# Patient Record
Sex: Female | Born: 1995
Health system: Southern US, Community
[De-identification: ages and names within clinical notes are randomized; demographics above are authoritative.]

## PROBLEM LIST (undated history)

## (undated) DIAGNOSIS — G43909 Migraine, unspecified, not intractable, without status migrainosus: Secondary | ICD-10-CM

## (undated) DIAGNOSIS — R102 Pelvic and perineal pain: Secondary | ICD-10-CM

## (undated) DIAGNOSIS — D693 Immune thrombocytopenic purpura: Secondary | ICD-10-CM

## (undated) DIAGNOSIS — Z862 Personal history of diseases of the blood and blood-forming organs and certain disorders involving the immune mechanism: Secondary | ICD-10-CM

## (undated) DIAGNOSIS — Z832 Family history of diseases of the blood and blood-forming organs and certain disorders involving the immune mechanism: Secondary | ICD-10-CM

## (undated) DIAGNOSIS — N809 Endometriosis, unspecified: Secondary | ICD-10-CM

## (undated) DIAGNOSIS — K5904 Chronic idiopathic constipation: Secondary | ICD-10-CM

## (undated) DIAGNOSIS — D6859 Other primary thrombophilia: Secondary | ICD-10-CM

## (undated) DIAGNOSIS — K589 Irritable bowel syndrome without diarrhea: Secondary | ICD-10-CM

## (undated) HISTORY — PX: LAPAROSCOPIC ENDOMETRIOSIS FULGURATION: SUR769

## (undated) HISTORY — DX: Endometriosis, unspecified: N80.9

## (undated) HISTORY — DX: Irritable bowel syndrome, unspecified: K58.9

## (undated) HISTORY — PX: TONSILLECTOMY: SUR1361

---
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
## 2015-04-08 ENCOUNTER — Emergency Department (HOSPITAL_COMMUNITY)
Admission: EM | Admit: 2015-04-08 | Discharge: 2015-04-08 | Disposition: A | Discharge status: Home / Self Care | Payer: BLUE CROSS/BLUE SHIELD | Attending: Emergency Medicine | Admitting: Emergency Medicine

## 2015-04-08 ENCOUNTER — Encounter (HOSPITAL_COMMUNITY): Payer: Self-pay | Admitting: Emergency Medicine

## 2015-04-08 ENCOUNTER — Emergency Department (HOSPITAL_COMMUNITY): Payer: BLUE CROSS/BLUE SHIELD

## 2015-04-08 DIAGNOSIS — R102 Pelvic and perineal pain: Secondary | ICD-10-CM

## 2015-04-08 DIAGNOSIS — N83201 Unspecified ovarian cyst, right side: Secondary | ICD-10-CM | POA: Diagnosis not present

## 2015-04-08 DIAGNOSIS — Z862 Personal history of diseases of the blood and blood-forming organs and certain disorders involving the immune mechanism: Secondary | ICD-10-CM | POA: Diagnosis not present

## 2015-04-08 DIAGNOSIS — R1032 Left lower quadrant pain: Secondary | ICD-10-CM | POA: Diagnosis present

## 2015-04-08 LAB — CBC
HEMATOCRIT: 41.6 % (ref 36.0–46.0)
Hemoglobin: 14.1 g/dL (ref 12.0–15.0)
MCH: 30.9 pg (ref 26.0–34.0)
MCHC: 33.9 g/dL (ref 30.0–36.0)
MCV: 91.2 fL (ref 78.0–100.0)
PLATELETS: 316 10*3/uL (ref 150–400)
RBC: 4.56 MIL/uL (ref 3.87–5.11)
RDW: 11.8 % (ref 11.5–15.5)
WBC: 11.7 10*3/uL — ABNORMAL HIGH (ref 4.0–10.5)

## 2015-04-08 LAB — COMPREHENSIVE METABOLIC PANEL
ALBUMIN: 4.7 g/dL (ref 3.5–5.0)
ALK PHOS: 54 U/L (ref 38–126)
ALT: 41 U/L (ref 14–54)
AST: 20 U/L (ref 15–41)
Anion gap: 10 (ref 5–15)
BUN: 12 mg/dL (ref 6–20)
CO2: 26 mmol/L (ref 22–32)
CREATININE: 0.7 mg/dL (ref 0.44–1.00)
Calcium: 9.9 mg/dL (ref 8.9–10.3)
Chloride: 107 mmol/L (ref 101–111)
GFR calc Af Amer: >60 mL/min (ref >60)
GFR calc non Af Amer: >60 mL/min (ref >60)
GLUCOSE: 90 mg/dL (ref 65–99)
POTASSIUM: 3.8 mmol/L (ref 3.5–5.1)
Sodium: 143 mmol/L (ref 135–145)
TOTAL PROTEIN: 7.8 g/dL (ref 6.5–8.1)
Total Bilirubin: 0.7 mg/dL (ref 0.3–1.2)

## 2015-04-08 LAB — WET PREP, GENITAL
Clue Cells Wet Prep HPF POC: NONE SEEN
Sperm: NONE SEEN
Trich, Wet Prep: NONE SEEN
YEAST WET PREP: NONE SEEN

## 2015-04-08 LAB — LIPASE, BLOOD: Lipase: 74 U/L — ABNORMAL HIGH (ref 11–51)

## 2015-04-08 LAB — I-STAT BETA HCG BLOOD, ED (MC, WL, AP ONLY): I-stat hCG, quantitative: <5 m[IU]/mL (ref <5)

## 2015-04-08 MED ORDER — MORPHINE SULFATE (PF) 2 MG/ML IV SOLN: 2.0000 mg | Freq: Once | INTRAVENOUS | Status: DC

## 2015-04-08 MED ORDER — KETOROLAC TROMETHAMINE 30 MG/ML IJ SOLN
30.0000 mg | Freq: Once | INTRAMUSCULAR | Status: AC
  Administered 2015-04-08: 30 mg via INTRAVENOUS
  Filled 2015-04-08: qty 1

## 2015-04-08 NOTE — ED Notes (Signed)
Pt made aware of need for urine specimen 

## 2015-04-08 NOTE — ED Notes (Signed)
Pt c/o diffuse abdominal pain. No n/v/diarrhea, no urinary symptoms. No aggravating/alleviating factors.

## 2015-04-08 NOTE — ED Provider Notes (Signed)
CSN: [id number]     Arrival date & time 04/08/15  1355 History   First MD Initiated Contact with Patient 04/08/15 1819     Chief Complaint  Patient presents with  . Abdominal Pain     (Consider location/radiation/quality/duration/timing/severity/associated sxs/prior Treatment) HPI 19 year old female who presents with low abdominal pain. History of protein S deficiency and ITP. At around 12:30PM today after eating, developed severe LLQ pain that radiated towards the right. Had IUD placed 2 weeks ago, and has been having mild vaginal spotting. Was tested for STDs which was negative prior to placement. No abnormal vaginal discharge, N/V/D, fevers, chills, dysuria, urinary frequency, flank pain.    Past Medical History  Diagnosis Date  . ITP (idiopathic thrombocytopenic purpura)   . Protein S deficiency (HCC)    History reviewed. No pertinent past surgical history. History reviewed. No pertinent family history. Social History  Substance Use Topics  . Smoking status: Never Smoker   . Smokeless tobacco: None  . Alcohol Use: None   OB History    No data available     Review of Systems 10/14 systems reviewed and are negative other than those stated in the HPI    Allergies  Review of patient's allergies indicates no known allergies.  Home Medications   Prior to Admission medications   Medication Sig Start Date End Date Taking? Authorizing Provider  HYDROcodone-acetaminophen (HYCET) 7.5-325 mg/15 ml solution take 15 milliliters by mouth every 4 hours if needed for pain 03/31/15  Yes Historical Provider, MD  ciprofloxacin (CIPRO) 500 MG tablet Take 1 tablet (500 mg total) by mouth 2 (two) times daily. One PO bid x 14 days Patient not taking: Reported on 04/08/2015 06/21/13   Elwin Mocha, MD   BP 103/66 mmHg  Pulse 87  Temp(Src) 97.8 F (36.6 C) (Oral)  Resp 16  SpO2 100% Physical Exam Physical Exam  Nursing note and vitals reviewed. Constitutional: Well developed, well  nourished, non-toxic, and in no acute distress Head: Normocephalic and atraumatic.  Mouth/Throat: Oropharynx is clear and moist.  Neck: Normal range of motion. Neck supple.  Cardiovascular: Normal rate and regular rhythm.   Pulmonary/Chest: Effort normal and breath sounds normal.  Abdominal: Soft. There is no tenderness. There is no rebound and no guarding.  Musculoskeletal: Normal range of motion.  Neurological: Alert, no facial droop, fluent speech, moves all extremities symmetrically Skin: Skin is warm and dry.  Psychiatric: Cooperative Pelvic: Normal external genitalia. Normal internal genitalia. No discharge. Dried blood within the vagina. Minimal cervical motion tenderness. Right adnexal tenderness. No adnexal masses appreciated.  ED Course  Procedures (including critical care time) Labs Review Labs Reviewed  WET PREP, GENITAL - Abnormal; Notable for the following:    WBC, Wet Prep HPF POC FEW (*)    All other components within normal limits  LIPASE, BLOOD - Abnormal; Notable for the following:    Lipase 74 (*)    All other components within normal limits  CBC - Abnormal; Notable for the following:    WBC 11.7 (*)    All other components within normal limits  COMPREHENSIVE METABOLIC PANEL  URINALYSIS, ROUTINE W REFLEX MICROSCOPIC (NOT AT [facility])  HIV ANTIBODY (ROUTINE TESTING)  I-STAT BETA HCG BLOOD, ED ([facility], [facility], [facility] ONLY)  GC/CHLAMYDIA PROBE AMP ([place]) NOT AT [facility]    Imaging Review US Transvaginal Non-ob  04/08/2015  CLINICAL DATA:  19 year old female with lower pelvic pain and right-sided suprapubic pain. Recent placement of IUD. EXAM: TRANSABDOMINAL AND  TRANSVAGINAL ULTRASOUND OF PELVIS DOPPLER ULTRASOUND OF OVARIES TECHNIQUE: Both transabdominal and transvaginal ultrasound examinations of the pelvis were performed. Transabdominal technique was performed for global imaging of the pelvis including uterus, ovaries, adnexal regions, and pelvic cul-de-sac. It was necessary  to proceed with endovaginal exam following the transabdominal exam to visualize the adnexal regions. Color and duplex Doppler ultrasound was utilized to evaluate blood flow to the ovaries. COMPARISON:  No priors. FINDINGS: Uterus Measurements: 6.4 x 4.1 x 4.9 cm. No fibroids or other mass visualized. Endometrium Thickness: 10 mm.  IUD noted. Right ovary Measurements: 2.8 x 2.4 x 2.5 cm. Complex lesion measuring 2.3 x 1.7 x 1.7 cm that is generally anechoic with increased through transmission, however, there appears to be several thin (<3 mm) septations. Left ovary Measurements: 3.6 x 2.4 x 2.7 cm. Normal appearance/no adnexal mass. Pulsed Doppler evaluation of both ovaries demonstrates normal low-resistance arterial and venous waveforms. Other findings Trace volume of free fluid in the cul-de-sac IMPRESSION: 1. IUD noted in the endometrial canal. 2. Trace volume of free fluid in the cul-de-sac, presumably physiologic in this young female patient. 3. Mildly complex cyst in the right ovary, as above. Short-interval follow up ultrasound in 6-12 weeks is recommended, preferably during the week following the patient's normal menses. Electronically Signed   By: Trudie Reed M.D.   On: 04/08/2015 19:55   US Pelvis Complete  04/08/2015  CLINICAL DATA:  19 year old female with lower pelvic pain and right-sided suprapubic pain. Recent placement of IUD. EXAM: TRANSABDOMINAL AND TRANSVAGINAL ULTRASOUND OF PELVIS DOPPLER ULTRASOUND OF OVARIES TECHNIQUE: Both transabdominal and transvaginal ultrasound examinations of the pelvis were performed. Transabdominal technique was performed for global imaging of the pelvis including uterus, ovaries, adnexal regions, and pelvic cul-de-sac. It was necessary to proceed with endovaginal exam following the transabdominal exam to visualize the adnexal regions. Color and duplex Doppler ultrasound was utilized to evaluate blood flow to the ovaries. COMPARISON:  No priors. FINDINGS: Uterus  Measurements: 6.4 x 4.1 x 4.9 cm. No fibroids or other mass visualized. Endometrium Thickness: 10 mm.  IUD noted. Right ovary Measurements: 2.8 x 2.4 x 2.5 cm. Complex lesion measuring 2.3 x 1.7 x 1.7 cm that is generally anechoic with increased through transmission, however, there appears to be several thin (<3 mm) septations. Left ovary Measurements: 3.6 x 2.4 x 2.7 cm. Normal appearance/no adnexal mass. Pulsed Doppler evaluation of both ovaries demonstrates normal low-resistance arterial and venous waveforms. Other findings Trace volume of free fluid in the cul-de-sac IMPRESSION: 1. IUD noted in the endometrial canal. 2. Trace volume of free fluid in the cul-de-sac, presumably physiologic in this young female patient. 3. Mildly complex cyst in the right ovary, as above. Short-interval follow up ultrasound in 6-12 weeks is recommended, preferably during the week following the patient's normal menses. Electronically Signed   By: Trudie Reed M.D.   On: 04/08/2015 19:55   [place] Art/ven Flow Abd Pelv Doppler Limited  04/08/2015  CLINICAL DATA:  19 year old female with lower pelvic pain and right-sided suprapubic pain. Recent placement of IUD. EXAM: TRANSABDOMINAL AND TRANSVAGINAL ULTRASOUND OF PELVIS DOPPLER ULTRASOUND OF OVARIES TECHNIQUE: Both transabdominal and transvaginal ultrasound examinations of the pelvis were performed. Transabdominal technique was performed for global imaging of the pelvis including uterus, ovaries, adnexal regions, and pelvic cul-de-sac. It was necessary to proceed with endovaginal exam following the transabdominal exam to visualize the adnexal regions. Color and duplex Doppler ultrasound was utilized to evaluate blood flow to the ovaries. COMPARISON:  No  priors. FINDINGS: Uterus Measurements: 6.4 x 4.1 x 4.9 cm. No fibroids or other mass visualized. Endometrium Thickness: 10 mm.  IUD noted. Right ovary Measurements: 2.8 x 2.4 x 2.5 cm. Complex lesion measuring 2.3 x 1.7 x 1.7 cm  that is generally anechoic with increased through transmission, however, there appears to be several thin (<3 mm) septations. Left ovary Measurements: 3.6 x 2.4 x 2.7 cm. Normal appearance/no adnexal mass. Pulsed Doppler evaluation of both ovaries demonstrates normal low-resistance arterial and venous waveforms. Other findings Trace volume of free fluid in the cul-de-sac IMPRESSION: 1. IUD noted in the endometrial canal. 2. Trace volume of free fluid in the cul-de-sac, presumably physiologic in this young female patient. 3. Mildly complex cyst in the right ovary, as above. Short-interval follow up ultrasound in 6-12 weeks is recommended, preferably during the week following the patient's normal menses. Electronically Signed   By: Trudie Reed M.D.   On: 04/08/2015 19:55   I have personally reviewed and evaluated these images and lab results as part of my medical decision-making.   MDM   Final diagnoses:  Pelvic pain in female  Right ovarian cyst    19 year old female who presents with low abdominal pain at 12:30 today. On arrival is nontoxic and in no acute distress. Vital signs are within normal limits. She is a soft and nonsurgical abdomen. No tenderness at McBurney's point just evidence of acute appendicitis. Her pain is primarily now in the right adnexa. Some pain suprapubically as well. Concern for pelvic process especially in the setting of her recent IUD. Unlikely to be PID as she has been tested for STDs just prior to IUD placement. Pelvic exam with dried blood, consistent with vaginal spotting since IUD. Mild cervical motion tenderness and primarily right adnexal tenderness to palpation. Pelvic ultrasound was performed and she has evidence of a complex cyst in her right ovary, with some mild free fluid in the pelvis. This is likely the etiology of her pain. IUD is in place on ultrasound. Low suspicion for torsion she has normal arterial and venous flow to her ovaries, pain not escalating in  severity. She did receive Toradol for pain control. Remainder of blood work including CBC, comprehensive metabolic panel, lipase, pregnancy test are overall unremarkable. Given supportive care instructions regarding her cyst. Given warning signs for ovarian torsion. She has follow-up with her gynecologist for IUD evaluation. She will also follow up with her primary care doctor prior to this. She and her mother expressed understanding of all discharge instructions for comfortable to plan of care.  Lavera Guise, MD 04/09/15 [phone]

## 2015-04-08 NOTE — ED Notes (Signed)
Pt. Unable to give urine at this time. 

## 2015-04-08 NOTE — Discharge Instructions (Signed)
Your have a cyst in the right ovary. Return without fail for worsening symptoms, including fever, vomiting and unable to keep down food/fluids, or any other symptoms concerning to you.  Pelvic Pain, Female Female pelvic pain can be caused by many different things and start from a variety of places. Pelvic pain refers to pain that is located in the lower half of the abdomen and between your hips. The pain may occur over a short period of time (acute) or may be reoccurring (chronic). The cause of pelvic pain may be related to disorders affecting the female reproductive organs (gynecologic), but it may also be related to the bladder, kidney stones, an intestinal complication, or muscle or skeletal problems. Getting help right away for pelvic pain is important, especially if there has been severe, sharp, or a sudden onset of unusual pain. It is also important to get help right away because some types of pelvic pain can be life threatening.  CAUSES  Below are only some of the causes of pelvic pain. The causes of pelvic pain can be in one of several categories.   Gynecologic.  Pelvic inflammatory disease.  Sexually transmitted infection.  Ovarian cyst or a twisted ovarian ligament (ovarian torsion).  Uterine lining that grows outside the uterus (endometriosis).  Fibroids, cysts, or tumors.  Ovulation.  Pregnancy.  Pregnancy that occurs outside the uterus (ectopic pregnancy).  Miscarriage.  Labor.  Abruption of the placenta or ruptured uterus.  Infection.  Uterine infection (endometritis).  Bladder infection.  Diverticulitis.  Miscarriage related to a uterine infection (septic abortion).  Bladder.  Inflammation of the bladder (cystitis).  Kidney stone(s).  Gastrointestinal.  Constipation.  Diverticulitis.  Neurologic.  Trauma.  Feeling pelvic pain because of mental or emotional causes (psychosomatic).  Cancers of the bowel or pelvis. EVALUATION  Your caregiver  will want to take a careful history of your concerns. This includes recent changes in your health, a careful gynecologic history of your periods (menses), and a sexual history. Obtaining your family history and medical history is also important. Your caregiver may suggest a pelvic exam. A pelvic exam will help identify the location and severity of the pain. It also helps in the evaluation of which organ system may be involved. In order to identify the cause of the pelvic pain and be properly treated, your caregiver may order tests. These tests may include:   A pregnancy test.  Pelvic ultrasonography.  An X-ray exam of the abdomen.  A urinalysis or evaluation of vaginal discharge.  Blood tests. HOME CARE INSTRUCTIONS   Only take over-the-counter or prescription medicines for pain, discomfort, or fever as directed by your caregiver.   Rest as directed by your caregiver.   Eat a balanced diet.   Drink enough fluids to make your urine clear or pale yellow, or as directed.   Avoid sexual intercourse if it causes pain.   Apply warm or cold compresses to the lower abdomen depending on which one helps the pain.   Avoid stressful situations.   Keep a journal of your pelvic pain. Write down when it started, where the pain is located, and if there are things that seem to be associated with the pain, such as food or your menstrual cycle.  Follow up with your caregiver as directed.  SEEK MEDICAL CARE IF:  Your medicine does not help your pain.  You have abnormal vaginal discharge. SEEK IMMEDIATE MEDICAL CARE IF:   You have heavy bleeding from the vagina.   Your pelvic  pain increases.   You feel light-headed or faint.   You have chills.   You have pain with urination or blood in your urine.   You have uncontrolled diarrhea or vomiting.   You have a fever or persistent symptoms for more than 3 days.  You have a fever and your symptoms suddenly get worse.   You are  being physically or sexually abused.   This information is not intended to replace advice given to you by your health care provider. Make sure you discuss any questions you have with your health care provider.   Document Released: 02/21/2004 Document Revised: 12/15/2014 Document Reviewed: 07/16/2011 Elsevier Interactive Patient Education [employer]

## 2015-04-08 NOTE — ED Notes (Signed)
Amanda Daugherty and this nurse attempt IV unsuccessful; pt transported to [place] at present time. IV consult placed.

## 2015-04-09 LAB — HIV ANTIBODY (ROUTINE TESTING W REFLEX): HIV Screen 4th Generation wRfx: NONREACTIVE

## 2015-04-12 LAB — GC/CHLAMYDIA PROBE AMP (~~LOC~~) NOT AT ARMC
Chlamydia: NEGATIVE
NEISSERIA GONORRHEA: NEGATIVE

## 2016-12-18 LAB — HM PAP SMEAR

## 2017-12-25 LAB — HM PAP SMEAR

## 2019-09-18 ENCOUNTER — Ambulatory Visit
Admission: EM | Admit: 2019-09-18 | Discharge: 2019-09-18 | Disposition: A | Discharge status: Home / Self Care | Payer: PRIVATE HEALTH INSURANCE | Attending: Physician Assistant | Admitting: Physician Assistant

## 2019-09-18 ENCOUNTER — Other Ambulatory Visit: Payer: Self-pay

## 2019-09-18 ENCOUNTER — Ambulatory Visit: Admission: EM | Admit: 2019-09-18 | Discharge status: Absent without leave | Payer: Self-pay | Source: Home / Self Care

## 2019-09-18 DIAGNOSIS — H6123 Impacted cerumen, bilateral: Secondary | ICD-10-CM

## 2019-09-18 NOTE — Discharge Instructions (Signed)
Ear wax removed today. If continue to have ear fullness, can try over the counter flonase/nasacort for possible eustachian tube dysfunction causing symptoms. Follow up for reevaluation if symptoms not improving, having pain.  

## 2019-09-18 NOTE — ED Triage Notes (Signed)
Bilateral ear pain since Sunday, used OTC ear wax removal kit without improvement

## 2019-09-18 NOTE — ED Provider Notes (Signed)
[facility]    CSN: [id number] Arrival date & time: 09/18/19  1715      History   Chief Complaint Chief Complaint  Patient presents with  . Otalgia    HPI RAVINDER HOFLAND is a 24 y.o. female.   24 year old female comes in for few day history of bilateral ear pain, L>R.  Also with muffled hearing, and thought this was due to earwax.  Tried OTC earwax removal kit, but was unsuccessful.  Does use cotton socks regularly.  Denies any ear drainage, recent swimming.  Denies URI symptoms, fever.     Past Medical History:  Diagnosis Date  . ITP (idiopathic thrombocytopenic purpura)   . Protein S deficiency ([place])     There are no problems to display for this patient.   History reviewed. No pertinent surgical history.  OB History   No obstetric history on file.      Home Medications    Prior to Admission medications   Not on File    Family History Family History  Problem Relation Age of Onset  . Healthy Mother   . Healthy Father     Social History Social History   Tobacco Use  . Smoking status: Never Smoker  Vaping Use  . Vaping Use: Never used  Substance Use Topics  . Alcohol use: Never  . Drug use: Never     Allergies   Patient has no known allergies.   Review of Systems Review of Systems  Reason unable to perform ROS: See HPI as above.     Physical Exam Triage Vital Signs ED Triage Vitals [09/18/19 1721]  Enc Vitals Group     BP 128/78     Pulse Rate 76     Resp 16     Temp 98 F (36.7 C)     Temp src      SpO2 97 %     Weight      Height      Head Circumference      Peak Flow      Pain Score 6     Pain Loc      Pain Edu?      Excl. in [place]?    No data found.  Updated Vital Signs BP 128/78   Pulse 76   Temp 98 F (36.7 C)   Resp 16   LMP 09/11/2019   SpO2 97%   Physical Exam Constitutional:      General: She is not in acute distress.    Appearance: Normal appearance. She is well-developed. She is not  toxic-appearing or diaphoretic.  HENT:     Head: Normocephalic and atraumatic.     Ears:     Comments: Tenderness to palpation of bilateral tragus.  No ear canal swelling, erythema, warmth.  Bilateral ear canal with cerumen impaction, TM not visible. Eyes:     Conjunctiva/sclera: Conjunctivae normal.     Pupils: Pupils are equal, round, and reactive to light.  Pulmonary:     Effort: Pulmonary effort is normal. No respiratory distress.     Comments: Speaking in full sentences without difficulty Musculoskeletal:     Cervical back: Normal range of motion and neck supple.  Skin:    General: Skin is warm and dry.  Neurological:     Mental Status: She is alert and oriented to person, place, and time.      UC Treatments / Results  Labs (all labs ordered are listed, but only abnormal results  are displayed) Labs Reviewed - No data to display  EKG   Radiology No results found.  Procedures Procedures (including critical care time)  Medications Ordered in UC Medications - No data to display  Initial Impression / Assessment and Plan / UC Course  I have reviewed the triage vital signs and the nursing notes.  Pertinent labs & imaging results that were available during my care of the patient were reviewed by me and considered in my medical decision making (see chart for details).  Patient tolerated ear irrigation well. TM visible without otitis media. Discussed using flonase/nasacort if continues with symptoms. Return precautions given.   Final Clinical Impressions(s) / UC Diagnoses   Final diagnoses:  Bilateral impacted cerumen    ED Prescriptions    None     PDMP not reviewed this encounter.   Ok Edwards, PA-C 09/18/19 1758

## 2019-11-27 ENCOUNTER — Other Ambulatory Visit: Payer: Self-pay

## 2019-11-27 ENCOUNTER — Ambulatory Visit
Admission: EM | Admit: 2019-11-27 | Discharge: 2019-11-27 | Disposition: A | Discharge status: Home / Self Care | Payer: PRIVATE HEALTH INSURANCE | Attending: Physician Assistant | Admitting: Physician Assistant

## 2019-11-27 DIAGNOSIS — J029 Acute pharyngitis, unspecified: Secondary | ICD-10-CM | POA: Diagnosis present

## 2019-11-27 DIAGNOSIS — Z1152 Encounter for screening for COVID-19: Secondary | ICD-10-CM | POA: Diagnosis not present

## 2019-11-27 LAB — POCT RAPID STREP A (OFFICE): Rapid Strep A Screen: NEGATIVE

## 2019-11-27 MED ORDER — LIDOCAINE VISCOUS HCL 2 % MT SOLN: OROMUCOSAL | 0 refills | Status: DC

## 2019-11-27 NOTE — Discharge Instructions (Signed)
Rapid strep negative. COVID testing ordered. Symptoms are most likely due to viral illness/ drainage down your throat. Start lidocaine for sore throat, do not eat or drink for the next 40 mins after use as it can stunt your gag reflex. Over the counter Flonase for nasal congestion/drainage. You can use over the counter nasal saline rinse such as neti pot for nasal congestion. Monitor for any worsening of symptoms, swelling of the throat, trouble breathing, trouble swallowing, leaning forward to breath, drooling, go to the emergency department for further evaluation needed.

## 2019-11-27 NOTE — ED Provider Notes (Signed)
[facility]    CSN: [id number] Arrival date & time: 11/27/19  1037      History   Chief Complaint Chief Complaint  Patient presents with  . Sore Throat    HPI Amanda Daugherty is a 24 y.o. female.   24 year old female comes in for 5 day of URI symptoms. Throat irritation, voice hoarseness, bilateral ear pain. No obvious rhinorrhea, nasal congestion, cough. Denies fever, chills, body aches. Denies abdominal pain, nausea, vomiting, diarrhea. Denies shortness of breath, loss of taste/smell. Fully COVID vaccinated. S/p tonsillectomy.      Past Medical History:  Diagnosis Date  . ITP (idiopathic thrombocytopenic purpura)   . Protein S deficiency ([facility])     There are no problems to display for this patient.   Past Surgical History:  Procedure Laterality Date  . TONSILLECTOMY      OB History   No obstetric history on file.      Home Medications    Prior to Admission medications   Medication Sig Start Date End Date Taking? Authorizing Provider  lidocaine (XYLOCAINE) 2 % solution 5-15 mL gurgle as needed 11/27/19   Belinda Fisher, PA-C    Family History Family History  Problem Relation Age of Onset  . Healthy Mother   . Healthy Father     Social History Social History   Tobacco Use  . Smoking status: Never Smoker  . Smokeless tobacco: Never Used  Vaping Use  . Vaping Use: Never used  Substance Use Topics  . Alcohol use: Yes  . Drug use: Never     Allergies   Patient has no known allergies.   Review of Systems Review of Systems  Reason unable to perform ROS: See HPI as above.     Physical Exam Triage Vital Signs ED Triage Vitals  Enc Vitals Group     BP 11/27/19 1117 115/76     Pulse Rate 11/27/19 1117 71     Resp 11/27/19 1117 16     Temp 11/27/19 1117 97.9 F (36.6 C)     Temp Source 11/27/19 1117 Oral     SpO2 11/27/19 1117 98 %     Weight --      Height --      Head Circumference --      Peak Flow --      Pain Score  11/27/19 1122 4     Pain Loc --      Pain Edu? --      Excl. in [place]? --    No data found.  Updated Vital Signs BP 115/76 (BP Location: Right Arm)   Pulse 71   Temp 97.9 F (36.6 C) (Oral)   Resp 16   LMP 11/26/2019   SpO2 98%   Physical Exam Constitutional:      General: She is not in acute distress.    Appearance: Normal appearance. She is well-developed. She is not ill-appearing, toxic-appearing or diaphoretic.  HENT:     Head: Normocephalic and atraumatic.     Right Ear: Tympanic membrane, ear canal and external ear normal. Tympanic membrane is not erythematous or bulging.     Left Ear: Tympanic membrane, ear canal and external ear normal. Tympanic membrane is not erythematous or bulging.     Nose:     Right Sinus: Frontal sinus tenderness present. No maxillary sinus tenderness.     Left Sinus: Frontal sinus tenderness present. No maxillary sinus tenderness.     Mouth/Throat:  Mouth: Mucous membranes are moist.     Pharynx: Oropharynx is clear. Uvula midline.  Eyes:     Conjunctiva/sclera: Conjunctivae normal.     Pupils: Pupils are equal, round, and reactive to light.  Cardiovascular:     Rate and Rhythm: Normal rate and regular rhythm.  Pulmonary:     Effort: Pulmonary effort is normal. No accessory muscle usage, prolonged expiration, respiratory distress or retractions.     Breath sounds: No decreased air movement or transmitted upper airway sounds. No decreased breath sounds.     Comments: LCTAB Musculoskeletal:     Cervical back: Normal range of motion and neck supple.  Skin:    General: Skin is warm and dry.  Neurological:     Mental Status: She is alert and oriented to person, place, and time.      UC Treatments / Results  Labs (all labs ordered are listed, but only abnormal results are displayed) Labs Reviewed  NOVEL CORONAVIRUS, NAA  CULTURE, GROUP A STREP [facility])  POCT RAPID STREP A (OFFICE)    EKG   Radiology No results  found.  Procedures Procedures (including critical care time)  Medications Ordered in UC Medications - No data to display  Initial Impression / Assessment and Plan / UC Course  I have reviewed the triage vital signs and the nursing notes.  Pertinent labs & imaging results that were available during my care of the patient were reviewed by me and considered in my medical decision making (see chart for details).    Rapid strep negative. COVID PCR test ordered. Patient to quarantine until testing results return. No alarming signs on exam. LCTAB. Symptomatic treatment discussed.  Push fluids.  Return precautions given.  Patient expresses understanding and agrees to plan.  Final Clinical Impressions(s) / UC Diagnoses   Final diagnoses:  Encounter for screening for COVID-19  Sore throat    ED Prescriptions    Medication Sig Dispense Auth. Provider   lidocaine (XYLOCAINE) 2 % solution 5-15 mL gurgle as needed 150 mL Belinda Fisher, PA-C     PDMP not reviewed this encounter.   Belinda Fisher, PA-C 11/27/19 1327

## 2019-11-27 NOTE — ED Triage Notes (Signed)
Pt states woke up Monday with her voice gone. States having a scratchy throat and pain to swallow. States now having bilateral ear pain. States had a tonsils removed 5 yrs ago and it feels like that healing pain. Denies difficuly swallowing.

## 2019-11-29 LAB — NOVEL CORONAVIRUS, NAA: SARS-CoV-2, NAA: NOT DETECTED

## 2019-11-29 LAB — SARS-COV-2, NAA 2 DAY TAT

## 2019-11-30 LAB — CULTURE, GROUP A STREP (THRC)

## 2019-12-15 ENCOUNTER — Ambulatory Visit: Payer: PRIVATE HEALTH INSURANCE | Admitting: Nurse Practitioner

## 2020-03-21 ENCOUNTER — Other Ambulatory Visit: Payer: Self-pay

## 2020-03-21 ENCOUNTER — Ambulatory Visit (INDEPENDENT_AMBULATORY_CARE_PROVIDER_SITE_OTHER): Payer: PRIVATE HEALTH INSURANCE | Admitting: Nurse Practitioner

## 2020-03-21 ENCOUNTER — Encounter: Payer: Self-pay | Admitting: Nurse Practitioner

## 2020-03-21 VITALS — BP 118/62 | HR 68 | Temp 97.7°F | Ht 68.0 in | Wt 180.0 lb

## 2020-03-21 DIAGNOSIS — Z0001 Encounter for general adult medical examination with abnormal findings: Secondary | ICD-10-CM

## 2020-03-21 DIAGNOSIS — Z136 Encounter for screening for cardiovascular disorders: Secondary | ICD-10-CM | POA: Diagnosis not present

## 2020-03-21 DIAGNOSIS — Z13220 Encounter for screening for lipoid disorders: Secondary | ICD-10-CM

## 2020-03-21 DIAGNOSIS — F411 Generalized anxiety disorder: Secondary | ICD-10-CM | POA: Insufficient documentation

## 2020-03-21 DIAGNOSIS — Z23 Encounter for immunization: Secondary | ICD-10-CM

## 2020-03-21 DIAGNOSIS — R110 Nausea: Secondary | ICD-10-CM | POA: Diagnosis not present

## 2020-03-21 DIAGNOSIS — F419 Anxiety disorder, unspecified: Secondary | ICD-10-CM | POA: Insufficient documentation

## 2020-03-21 DIAGNOSIS — K5904 Chronic idiopathic constipation: Secondary | ICD-10-CM | POA: Diagnosis not present

## 2020-03-21 MED ORDER — DOCUSATE SODIUM 100 MG PO CAPS: 100.0000 mg | ORAL_CAPSULE | Freq: Two times a day (BID) | ORAL | 0 refills | Status: DC

## 2020-03-21 NOTE — Assessment & Plan Note (Signed)
Managed by GYN with use of klonopin prn

## 2020-03-21 NOTE — Patient Instructions (Signed)
Schedule lab appt for fasting blood draw (need to be fasting at least 6-8hrs prior, ok to drink water)  Start colace daily for constipation.   you will be contacted to schedule an appt for ABD [place].  Sign medical release to get records from your GYN  Thank you for choosing [facility].  Chronic Constipation  Chronic constipation is a condition in which a person has three or fewer bowel movements a week, for three months or longer. This condition is especially common in older adults. The two main kinds of chronic constipation are secondary constipation and functional constipation. Secondary constipation results from another condition or a treatment. Functional constipation, also called primary or idiopathic constipation, is divided into three types:  Normal transit constipation. In this type, movement of stool through the colon (stool transit) occurs normally.  Slow transit constipation. In this type, stool moves slowly through the colon.  Outlet constipation or pelvic floor dysfunction. In this type, the nerves and muscles that empty the rectum do not work normally. What are the causes? Causes of secondary constipation may include:  Failing to drink enough fluid, eat enough food or fiber, or get physically active.  Pregnancy.  A tear in the anus (anal fissure).  Blockage in the bowel (bowel obstruction).  Narrowing of the bowel (bowel stricture).  Having a long-term medical condition, such as: ? Diabetes. ? Hypothyroidism. ? Multiple sclerosis. ? Parkinson disease. ? Stroke. ? Spinal cord injury. ? Dementia. ? Colon cancer. ? Inflammatory bowel disease (IBD). ? Iron-deficiency anemia. ? Outward collapse of the rectum (rectal prolapse). ? Hemorrhoids.  Taking certain medicines, including: ? Narcotics. These are a certain type of prescription pain medicine. ? Antacids. ? Iron supplements. ? Water pills (diuretics). ? Certain blood pressure  medicines. ? Anti-seizure medicines. ? Antidepressants. ? Medicines for Parkinson disease. The cause of functional constipation is not known, but some conditions are associated with it. These conditions include:  Stress.  Problems in the nerves and muscles that control stool transit.  Weak or impaired pelvic floor muscles. What increases the risk? You may be at higher risk for chronic constipation if you:  Are older than age 28.  Are female.  Live in a long-term care facility.  Do not get much exercise or physical activity (have a sedentary lifestyle).  Do not drink enough fluids.  Do not eat enough food, especially fiber.  Have a long-term disease.  Have a mental health disorder or eating disorder.  Take many medicines. What are the signs or symptoms? The main symptom of chronic constipation is having three or fewer bowel movements a week for several weeks. Other signs and symptoms may vary from person to person. These include:  Pushing hard (straining) to pass stool.  Painful bowel movements.  Having hard or lumpy stools.  Having lower belly discomfort, such as cramps or bloating.  Being unable to have a bowel movement when you feel the urge.  Feeling like you still need to pass stool after a bowel movement.  Feeling that you have something in your rectum that is blocking or preventing bowel movements.  Seeing blood on the toilet paper or in your stool.  Worsening confusion (in older adults). How is this diagnosed? This condition may be diagnosed based on:  Symptoms and medical history. You will be asked about your symptoms, lifestyle, diet, and any medicines that you are taking.  Physical exam. ? Your belly (abdomen) will be examined. ? A digital rectal exam may be done.  For this exam, a health care provider places a lubricated, gloved finger into the rectum.  Other tests to check for any underlying causes of your constipation. These may be ordered if  you have bleeding in your rectum, weight loss, or a family history of colon cancer. In these cases, you may have: ? Imaging studies of the colon. These may include X-ray, ultrasound, or CT scan. ? Blood tests. ? A procedure to examine the inside of your colon (colonoscopy). ? More specialized tests to check:  Whether your anal sphincter works well. This is a ring-shaped muscle that controls the closing of the anus.  How well food moves through your colon. ? Tests to measure the nerve signal in your pelvic floor muscles (electromyography). How is this treated? Treatment for chronic constipation depends on the cause. Most often, treatment starts with:  Being more active and getting regular exercise.  Drinking more fluids.  Adding fiber to your diet. Sources of fiber include fruits, vegetables, whole grains, and fiber supplements.  Using medicines such as stool softeners or medicines that increase contractions in your digestive system (pro-motility agents).  Training your pelvic muscles with biofeedback.  Surgery, if there is obstruction. Treatment for secondary chronic constipation depends on the underlying condition. You may need to:  Stop or change some medicines if they cause constipation.  Use a fiber supplement (bulk laxative) or stool softener.  Use prescription laxative. This works by [employer] into your colon (osmotic laxative). You may also need to see a specialist who treats conditions of the digestive system (gastroenterologist). Follow these instructions at home:   Take over-the-counter and prescription medicines only as told by your health care provider.  If you are taking a laxative, take it as told by your health care provider.  Eat a balanced diet that includes enough fiber. Ask your health care provider to recommend a diet that is right for you.  Drink clear fluids, especially water. Avoid drinking alcohol, caffeine, and soda.  Drink enough fluid to  keep your urine pale yellow.  Get some physical activity every day. Ask your health care provider what physical activities are safe for you.  Get colon cancer screenings as told by your health care provider.  Keep all follow-up visits as told by your health care provider. This is important. Contact a health care provider if:  You are having three or fewer bowel movements a week.  Your stools are hard or lumpy.  You notice blood on the toilet paper or in your stool after you have a bowel movement.  You have unexplained weight loss.  You have rectum (rectal) pain.  You have stool leakage.  You experience nausea or vomiting. Get help right away if:  You have rectal bleeding or you pass blood clots.  You have severe rectal pain.  You have body tissue that pushes out (protrudes) from your anus.  You have severe pain or bloating (distension) in your abdomen.  You have vomiting that you cannot control. Summary  Chronic constipation is a condition in which a person has three or fewer bowel movements a week, for three months or longer.  You may have a higher risk for this condition if you are an older adult, or if you do not drink enough water or get enough physical activity (are sedentary).  Treatment for this condition depends on the cause. Most treatments for chronic constipation include adding fiber to your diet, drinking more fluids, and getting more physical activity. You may also  need to treat any underlying medical conditions or stop or change certain medicines if they cause constipation.  If lifestyle changes do not relieve constipation, your health care provider may recommend taking a laxative. This information is not intended to replace advice given to you by your health care provider. Make sure you discuss any questions you have with your health care provider. Document Revised: 03/08/2017 Document Reviewed: 12/11/2016 Elsevier Patient Education  2020 [employer].

## 2020-03-21 NOTE — Assessment & Plan Note (Signed)
Chronic, waxing and waning, hard stool 1-2x/week Associated with ABD bloating. Improves with use of laxative. Maintain high fiber diet and adequate oral hydration  Start colace 100-200mg  OTC.

## 2020-03-21 NOTE — Assessment & Plan Note (Addendum)
Ongoing since high school. Worsening,Triggered by food intake. Intermittent, each episode last for 1-2days. Associated with bloating sensation Denies weight loss or vomiting or blood in stool or heratburn Also has hx of chronic constipation.  Check ABD [place] and H-pylori

## 2020-03-21 NOTE — Assessment & Plan Note (Signed)
>>  ASSESSMENT AND PLAN FOR ANXIETY WRITTEN ON 03/21/2020 12:39 PM BY Klarissa Mcilvain LUM, NP  Managed by GYN with use of klonopin prn

## 2020-03-21 NOTE — Progress Notes (Signed)
Subjective:    Patient ID: Amanda Daugherty, female    DOB: 01-Jul-1995, 24 y.o.   MRN: [id number]  Patient presents today for CPE and eval of chronic complaints.  HPI Anxiety Managed by GYN with use of klonopin prn  Nausea Ongoing since high school. Worsening,Triggered by food intake. Intermittent, each episode last for 1-2days. Associated with bloating sensation Denies weight loss or vomiting or blood in stool or heratburn Also has hx of chronic constipation.  Check ABD [place] and H-pylori  Chronic idiopathic constipation Chronic, waxing and waning, hard stool 1-2x/week Associated with ABD bloating. Improves with use of laxative. Maintain high fiber diet and adequate oral hydration  Start colace 100-200mg  OTC.  Sexual History (orientation,birth control, marital status, STD):IUD in place, GYN has completed pelvic and breast exam per patient.  Depression/Suicide: Depression screen PHQ 2/9 03/21/2020  Decreased Interest 1  Down, Depressed, Hopeless 1  PHQ - 2 Score 2  Altered sleeping 1  Tired, decreased energy 0  Change in appetite 1  Feeling bad or failure about yourself  1  Trouble concentrating 0  Moving slowly or fidgety/restless 0  Suicidal thoughts 0  PHQ-9 Score 5  Difficult doing work/chores Very difficult   GAD 7 : Generalized Anxiety Score 03/21/2020  Nervous, Anxious, on Edge 1  Control/stop worrying 1  Worry too much - different things 1  Trouble relaxing 0  Restless 0  Easily annoyed or irritable 2  Afraid - awful might happen 2  Total GAD 7 Score 7  Anxiety Difficulty Very difficult   Vision:not needed  Dental:up to date  Immunizations: (TDAP, Hep C screen, Pneumovax, Influenza, zoster)  Health Maintenance  Topic Date Due  .  Hepatitis C: One time screening is recommended by Center for Disease Control  (CDC) for  adults born from [birth year] through 1966.   Never done  . Tetanus Vaccine  Never done  . Pap Smear  Never done  . Pap Smear  Never done   . Flu Shot  Completed  . COVID-19 Vaccine  Completed  . HIV Screening  Completed   Weight:  Wt Readings from Last 3 Encounters:  03/21/20 180 lb (81.6 kg)  06/21/13 168 lb (76.2 kg) (92 %, Z= 1.42)*   * Growth percentiles are based on CDC (Girls, 2-20 Years) data.   Fall Risk: Fall Risk  03/21/2020  Falls in the past year? 0  Number falls in past yr: 0  Injury with Fall? 0   Medications and allergies reviewed with patient and updated if appropriate.  Patient Active Problem List   Diagnosis Date Noted  . Anxiety 03/21/2020  . Nausea 03/21/2020  . Chronic idiopathic constipation 03/21/2020  . H/O idiopathic thrombocytopenic purpura 09/20/1998    Current Outpatient Medications on File Prior to Visit  Medication Sig Dispense Refill  . clonazePAM (KLONOPIN) 0.5 MG tablet Take 0.5 mg by mouth every 30 (thirty) days. Pt states she takes monthly as needed    . SUMAtriptan (IMITREX) 100 MG tablet Take 100 mg by mouth as needed. Pt states she takes this medication as needed     No current facility-administered medications on file prior to visit.    Past Medical History:  Diagnosis Date  . ITP (idiopathic thrombocytopenic purpura)   . Protein S deficiency [facility])     Past Surgical History:  Procedure Laterality Date  . TONSILLECTOMY      Social History   Socioeconomic History  . Marital status: Single    Spouse  name: Not on file  . Number of children: Not on file  . Years of education: Not on file  . Highest education level: Not on file  Occupational History  . Not on file  Tobacco Use  . Smoking status: Never Smoker  . Smokeless tobacco: Never Used  Vaping Use  . Vaping Use: Never used  Substance and Sexual Activity  . Alcohol use: Yes  . Drug use: Never  . Sexual activity: Not on file  Other Topics Concern  . Not on file  Social History Narrative  . Not on file   Social Determinants of Health   Financial Resource Strain: Not on file  Food Insecurity:  Not on file  Transportation Needs: Not on file  Physical Activity: Not on file  Stress: Not on file  Social Connections: Not on file    Family History  Problem Relation Age of Onset  . Healthy Mother   . Healthy Father         Review of Systems  Constitutional: Negative for fever, malaise/fatigue and weight loss.  HENT: Negative for congestion and sore throat.   Eyes:       Negative for visual changes  Respiratory: Negative for cough and shortness of breath.   Cardiovascular: Negative for chest pain, palpitations and leg swelling.  Gastrointestinal: Positive for constipation and nausea. Negative for blood in stool, diarrhea and heartburn.  Genitourinary: Negative for dysuria, frequency and urgency.  Musculoskeletal: Negative for falls, joint pain and myalgias.  Skin: Negative for rash.  Neurological: Negative for dizziness, sensory change and headaches.  Endo/Heme/Allergies: Does not bruise/bleed easily.  Psychiatric/Behavioral: Negative for depression, substance abuse and suicidal ideas. The patient is not nervous/anxious.     Objective:   Vitals:   03/21/20 1138  BP: 118/62  Pulse: 68  Temp: 97.7 F (36.5 C)  SpO2: 98%    Body mass index is 27.37 kg/m.   Physical Examination:  Physical Exam Vitals reviewed.  Constitutional:      General: She is not in acute distress.    Appearance: She is well-developed.  HENT:     Right Ear: Tympanic membrane, ear canal and external ear normal.     Left Ear: Tympanic membrane, ear canal and external ear normal.     Mouth/Throat:     Mouth: Oropharynx is clear and moist.  Eyes:     Extraocular Movements: Extraocular movements intact and EOM normal.     Conjunctiva/sclera: Conjunctivae normal.  Cardiovascular:     Rate and Rhythm: Normal rate and regular rhythm.     Pulses: Normal pulses.     Heart sounds: Normal heart sounds.  Pulmonary:     Effort: Pulmonary effort is normal. No respiratory distress.     Breath  sounds: Normal breath sounds.  Chest:     Chest wall: No tenderness.  Abdominal:     General: Bowel sounds are normal.     Palpations: Abdomen is soft. There is no mass.     Tenderness: There is no guarding.  Musculoskeletal:        General: No edema. Normal range of motion.     Cervical back: Normal range of motion and neck supple.     Right lower leg: No edema.     Left lower leg: No edema.  Lymphadenopathy:     Cervical: No cervical adenopathy.  Skin:    General: Skin is warm and dry.  Neurological:     Mental Status: She is alert and  oriented to person, place, and time.     Deep Tendon Reflexes: Reflexes are normal and symmetric.  Psychiatric:        Mood and Affect: Mood normal.        Behavior: Behavior normal.        Thought Content: Thought content normal.    ASSESSMENT and PLAN: This visit occurred during the SARS-CoV-2 public health emergency.  Safety protocols were in place, including screening questions prior to the visit, additional usage of staff PPE, and extensive cleaning of exam room while observing appropriate contact time as indicated for disinfecting solutions.   Eline was seen today for new patient (initial visit).  Diagnoses and all orders for this visit:  Encounter for preventative adult health care exam with abnormal findings -     CBC with Differential/Platelet; Future -     Comprehensive metabolic panel; Future -     Lipid panel; Future  Influenza vaccine needed -     Flu Vaccine QUAD 6+ mos PF IM (Fluarix Quad PF)  Encounter for lipid screening for cardiovascular disease -     Lipid panel; Future  Nausea -     US Abdomen Limited RUQ (LIVER/GB); Future -     H. pylori breath test; Future  Chronic idiopathic constipation -     docusate sodium (COLACE) 100 MG capsule; Take 1-2 capsules (100-200 mg total) by mouth 2 (two) times daily.  will obtain PAP smear results from GYN    Problem List Items Addressed This Visit      Digestive    Chronic idiopathic constipation    Chronic, waxing and waning, hard stool 1-2x/week Associated with ABD bloating. Improves with use of laxative. Maintain high fiber diet and adequate oral hydration  Start colace 100-200mg  OTC.      Relevant Medications   docusate sodium (COLACE) 100 MG capsule     Other   Nausea    Ongoing since high school. Worsening,Triggered by food intake. Intermittent, each episode last for 1-2days. Associated with bloating sensation Denies weight loss or vomiting or blood in stool or heratburn Also has hx of chronic constipation.  Check ABD [place] and H-pylori      Relevant Orders   US Abdomen Limited RUQ (LIVER/GB)   H. pylori breath test    Other Visit Diagnoses    Encounter for preventative adult health care exam with abnormal findings    -  Primary   Relevant Orders   CBC with Differential/Platelet   Comprehensive metabolic panel   Lipid panel   Influenza vaccine needed       Relevant Orders   Flu Vaccine QUAD 6+ mos PF IM (Fluarix Quad PF) (Completed)   Encounter for lipid screening for cardiovascular disease       Relevant Orders   Lipid panel      Follow up: No follow-ups on file.  Alysia Penna, NP

## 2020-03-22 ENCOUNTER — Other Ambulatory Visit (INDEPENDENT_AMBULATORY_CARE_PROVIDER_SITE_OTHER): Payer: PRIVATE HEALTH INSURANCE

## 2020-03-22 DIAGNOSIS — Z13220 Encounter for screening for lipoid disorders: Secondary | ICD-10-CM

## 2020-03-22 DIAGNOSIS — Z0001 Encounter for general adult medical examination with abnormal findings: Secondary | ICD-10-CM

## 2020-03-22 DIAGNOSIS — Z136 Encounter for screening for cardiovascular disorders: Secondary | ICD-10-CM | POA: Diagnosis not present

## 2020-03-22 DIAGNOSIS — R110 Nausea: Secondary | ICD-10-CM

## 2020-03-22 LAB — CBC WITH DIFFERENTIAL/PLATELET
Basophils Absolute: 0 10*3/uL (ref 0.0–0.1)
Basophils Relative: 0.5 % (ref 0.0–3.0)
Eosinophils Absolute: 0 10*3/uL (ref 0.0–0.7)
Eosinophils Relative: 0.6 % (ref 0.0–5.0)
HCT: 40.9 % (ref 36.0–46.0)
Hemoglobin: 13.7 g/dL (ref 12.0–15.0)
Lymphocytes Relative: 19.2 % (ref 12.0–46.0)
Lymphs Abs: 1.2 10*3/uL (ref 0.7–4.0)
MCHC: 33.5 g/dL (ref 30.0–36.0)
MCV: 91.2 fl (ref 78.0–100.0)
Monocytes Absolute: 0.6 10*3/uL (ref 0.1–1.0)
Monocytes Relative: 10.1 % (ref 3.0–12.0)
Neutro Abs: 4.3 10*3/uL (ref 1.4–7.7)
Neutrophils Relative %: 69.6 % (ref 43.0–77.0)
Platelets: 205 10*3/uL (ref 150.0–400.0)
RBC: 4.48 Mil/uL (ref 3.87–5.11)
RDW: 12.5 % (ref 11.5–15.5)
WBC: 6.2 10*3/uL (ref 4.0–10.5)

## 2020-03-22 LAB — LIPID PANEL
Cholesterol: 191 mg/dL (ref 0–200)
HDL: 53.8 mg/dL (ref >39.00)
LDL Cholesterol: 123 mg/dL — ABNORMAL HIGH (ref 0–99)
NonHDL: 137.31
Total CHOL/HDL Ratio: 4
Triglycerides: 73 mg/dL (ref 0.0–149.0)
VLDL: 14.6 mg/dL (ref 0.0–40.0)

## 2020-03-22 LAB — COMPREHENSIVE METABOLIC PANEL
ALT: 13 U/L (ref 0–35)
AST: 14 U/L (ref 0–37)
Albumin: 4.3 g/dL (ref 3.5–5.2)
Alkaline Phosphatase: 38 U/L — ABNORMAL LOW (ref 39–117)
BUN: 11 mg/dL (ref 6–23)
CO2: 27 mEq/L (ref 19–32)
Calcium: 9.1 mg/dL (ref 8.4–10.5)
Chloride: 102 mEq/L (ref 96–112)
Creatinine, Ser: 0.72 mg/dL (ref 0.40–1.20)
GFR: 116.32 mL/min (ref >60.00)
Glucose, Bld: 89 mg/dL (ref 70–99)
Potassium: 3.7 mEq/L (ref 3.5–5.1)
Sodium: 137 mEq/L (ref 135–145)
Total Bilirubin: 0.8 mg/dL (ref 0.2–1.2)
Total Protein: 7.1 g/dL (ref 6.0–8.3)

## 2020-03-23 LAB — H. PYLORI BREATH TEST: H. pylori Breath Test: NOT DETECTED

## 2020-04-12 ENCOUNTER — Ambulatory Visit
Admission: RE | Admit: 2020-04-12 | Discharge: 2020-04-12 | Disposition: A | Discharge status: Home / Self Care | Payer: PRIVATE HEALTH INSURANCE | Source: Ambulatory Visit | Attending: Nurse Practitioner | Admitting: Nurse Practitioner

## 2020-04-12 DIAGNOSIS — R110 Nausea: Secondary | ICD-10-CM

## 2020-04-13 DIAGNOSIS — G43909 Migraine, unspecified, not intractable, without status migrainosus: Secondary | ICD-10-CM | POA: Insufficient documentation

## 2020-04-15 ENCOUNTER — Telehealth: Payer: Self-pay | Admitting: Nurse Practitioner

## 2020-04-15 ENCOUNTER — Telehealth: Payer: Self-pay | Admitting: Hematology

## 2020-04-15 DIAGNOSIS — K5904 Chronic idiopathic constipation: Secondary | ICD-10-CM

## 2020-04-15 DIAGNOSIS — R110 Nausea: Secondary | ICD-10-CM

## 2020-04-15 NOTE — Telephone Encounter (Signed)
Caller Name: Gizelle Call back phone #: [phone]  Reason for Call: pt returning call for [place] results

## 2020-04-15 NOTE — Telephone Encounter (Signed)
Received a new hem referral from Dr. Arelia Sneddon for protein s deficiency. Pt has been cld and scheduled to see Dr. Candise Che on 1/20 at 1pm.

## 2020-04-15 NOTE — Telephone Encounter (Signed)
-----   Message from Priscella Mann, [place] sent at 04/15/2020  1:07 PM EST ----- Patient notified and verbalized understanding Pt states she is okay with referral.

## 2020-04-18 ENCOUNTER — Ambulatory Visit: Payer: PRIVATE HEALTH INSURANCE | Admitting: Nurse Practitioner

## 2020-05-02 ENCOUNTER — Encounter: Payer: Self-pay | Admitting: Nurse Practitioner

## 2020-05-04 NOTE — Progress Notes (Signed)
HEMATOLOGY/ONCOLOGY CONSULTATION NOTE  Date of Service: 05/04/2020  Patient Care Team: Nche, Bonna Gains, NP as PCP - General (Internal Medicine)  CHIEF COMPLAINTS/PURPOSE OF CONSULTATION:  Protein S Deficiency  HISTORY OF PRESENTING ILLNESS:   Amanda Daugherty is a wonderful 25 y.o. female who has been referred to [place] by Dr. Arelia Sneddon, MD for evaluation and management of protein s deficiency. The pt reports that she is doing well overall.  The pt reports that she was referred by her OBGYN due to her mother's history of Protein S deficiency and coagulation disorder. Her mom was diagnosed with this in her 30s and it was discovered through her pregnancy.   The pt notes that she was personal diagnosed with ITP when she was 25 years old. This was a short term 1-2 year acute disease that is no longer been affecting the pt. The pt denies any history of blood clots and denies pregnancy. The pt has an IUD the last five years but denies any estrogen-containing birth controls.   She notes that she herself has been had personal hx of venous thrombo-embolism.  The pt notes that she received a complete hypercoagulation panel when she was 17 or 18, but is unsure of where nor the levels they instructed her.  The pt had a Tonsillectomy when she was 25 years old, with no complications or any major surgeries. The pt denies any stress factors that increase he risk of clotting.   The pt notes that she frequently travels long distance, as she used to live in [place]. The pt also notes her boyfriend lives in [place] and she frequently takes 8 hour flights every 2 months. The pt denies Aspirin usage and admits to using compression socks during.  The pt also notes that the pt was confirmed to have Endometriosis without doing the surgery. The pt also had an abnormal pap smear. She also notes that she had a Biopsy this morning.   The pt is unaware of any medication allergies. She takes Colace daily, Imitrex  and Klonopin prn. The pt denies chemical use and tobacco use, and only drinks alcohol socially. The pt notes that she currently works remotely as a Programmer, systems.  On review of systems, pt reports chronic headaches, Endometriosis and denies fevers, chills, night sweats, black/bloody stools, leg swelling, abdominal pain and any other symptoms.   MEDICAL HISTORY:  Past Medical History:  Diagnosis Date  . ITP (idiopathic thrombocytopenic purpura)   . Protein S deficiency (HCC)     SURGICAL HISTORY: Past Surgical History:  Procedure Laterality Date  . TONSILLECTOMY      SOCIAL HISTORY: Social History   Socioeconomic History  . Marital status: Single    Spouse name: Not on file  . Number of children: Not on file  . Years of education: Not on file  . Highest education level: Not on file  Occupational History  . Not on file  Tobacco Use  . Smoking status: Never Smoker  . Smokeless tobacco: Never Used  Vaping Use  . Vaping Use: Never used  Substance and Sexual Activity  . Alcohol use: Yes  . Drug use: Never  . Sexual activity: Not on file  Other Topics Concern  . Not on file  Social History Narrative  . Not on file   Social Determinants of Health   Financial Resource Strain: Not on file  Food Insecurity: Not on file  Transportation Needs: Not on file  Physical Activity: Not on file  Stress: Not on file  Social Connections: Not on file  Intimate Partner Violence: Not on file    FAMILY HISTORY: Family History  Problem Relation Age of Onset  . Healthy Mother   . Clotting disorder Mother   . Healthy Father   . Hyperlipidemia Father   . Colon cancer Maternal Grandmother   . Diabetes Maternal Grandfather   . Heart disease Maternal Grandfather   . Heart disease Paternal Grandfather   . Melanoma Paternal Grandfather     ALLERGIES:  has No Known Allergies.  MEDICATIONS:  Current Outpatient Medications  Medication Sig Dispense Refill  . clonazePAM  (KLONOPIN) 0.5 MG tablet Take 0.5 mg by mouth every 30 (thirty) days. Pt states she takes monthly as needed    . docusate sodium (COLACE) 100 MG capsule Take 1-2 capsules (100-200 mg total) by mouth 2 (two) times daily.  0  . SUMAtriptan (IMITREX) 100 MG tablet Take 100 mg by mouth as needed. Pt states she takes this medication as needed     No current facility-administered medications for this visit.    REVIEW OF SYSTEMS:    10 Point review of Systems was done is negative except as noted above.  PHYSICAL EXAMINATION: ECOG PERFORMANCE STATUS: 0 - Asymptomatic  . Vitals:   05/05/20 1312  BP: 123/71  Pulse: 76  Resp: 20  Temp: 97.8 F (36.6 C)  SpO2: 100%   Filed Weights   05/05/20 1312  Weight: 176 lb (79.8 kg)   .Body mass index is 26.76 kg/m.   GENERAL:alert, in no acute distress and comfortable SKIN: no acute rashes, no significant lesions EYES: conjunctiva are pink and non-injected, sclera anicteric OROPHARYNX: MMM, no exudates, no oropharyngeal erythema or ulceration NECK: supple, no JVD LYMPH:  no palpable lymphadenopathy in the cervical, axillary or inguinal regions LUNGS: clear to auscultation b/l with normal respiratory effort HEART: regular rate & rhythm ABDOMEN:  normoactive bowel sounds , non tender, not distended. Extremity: no pedal edema PSYCH: alert & oriented x 3 with fluent speech NEURO: no focal motor/sensory deficits  LABORATORY DATA:  I have reviewed the data as listed  . CBC Latest Ref Rng & Units 05/05/2020 03/22/2020 04/08/2015  WBC 4.0 - 10.5 K/uL 5.6 6.2 11.7(H)  Hemoglobin 12.0 - 15.0 g/dL 16.0 10.9 32.3  Hematocrit 36.0 - 46.0 % 41.7 40.9 41.6  Platelets 150 - 400 K/uL 264 205.0 316    . CMP Latest Ref Rng & Units 05/05/2020 03/22/2020 04/08/2015  Glucose 70 - 99 mg/dL 557(D) 89 90  BUN 6 - 20 mg/dL 11 11 12   Creatinine 0.44 - 1.00 mg/dL 2.20 2.54 2.70  Sodium 135 - 145 mmol/L 139 137 143  Potassium 3.5 - 5.1 mmol/L 4.0 3.7 3.8   Chloride 98 - 111 mmol/L 105 102 107  CO2 22 - 32 mmol/L 27 27 26   Calcium 8.9 - 10.3 mg/dL 9.3 9.1 9.9  Total Protein 6.5 - 8.1 g/dL 7.6 7.1 7.8  Total Bilirubin 0.3 - 1.2 mg/dL 0.6 0.8 0.7  Alkaline Phos 38 - 126 U/L 44 38(L) 54  AST 15 - 41 U/L 14(L) 14 20  ALT 0 - 44 U/L 14 13 41     RADIOGRAPHIC STUDIES: I have personally reviewed the radiological images as listed and agreed with the findings in the report. US Abdomen Limited RUQ (LIVER/GB)  Result Date: 04/12/2020 CLINICAL DATA:  Chronic postprandial abdominal pain, nausea. EXAM: ULTRASOUND ABDOMEN LIMITED RIGHT UPPER QUADRANT COMPARISON:  None. FINDINGS: Gallbladder: No gallstones  or wall thickening visualized. No sonographic Murphy sign noted by sonographer. Common bile duct: Diameter: 3 mm which is within normal limits. Liver: No focal lesion identified. Within normal limits in parenchymal echogenicity. Portal vein is patent on color Doppler imaging with normal direction of blood flow towards the liver. Other: None. IMPRESSION: No abnormality seen in the right upper quadrant of the abdomen. Electronically Signed   By: Lupita Raider M.D.   On: 04/12/2020 14:39    ASSESSMENT & PLAN:   25 yo with   1) Familyhx of Protein S deficiency in her mother No personal hx of VTE Has h/o endometriosis and wanted to be evaluate for hypercoagulable state prior to consideration of treatment options for endometriosis including use of estrogen containing birth control pills. PLAN -Advised pt that Protein S deficiency can present itself in different ways. If mild, may only present with VTE with other triggers. -Will get lab work today to observe if she truly has Protein S deficiency and is at increased risk for clots. -Recommend pt avoid estrogen-containing birth control as best possible. -if she does have significant protein S deficiency she will need preventative dose blood thinners if becoming pregnant. -Advised pt of potential to take  Xarelto or Eliquis when predictably increasing risk for clots, such as long distance travel. -Advised pt that could use blood thinners if need to take birth control pills for Endometriosis/ surgery if required. -Advised pt that potential laparoscopic surgery is not high risk for clotting, but will still need VTE prophylaxis peri-operatively.. -Discussed risk mitigation strategy when dealing with increased risk for clotting. -Advise pt that most accurate labs for baseline levels occur on first day of period. -Will get full hypercoagulability panel today. -Will see back in 2 weeks via phone.  FOLLOW UP Labs  Phone visit with Dr Candise Che in 2 weeks  . Orders Placed This Encounter  Procedures  . CBC with Differential/Platelet    Standing Status:   Future    Number of Occurrences:   1    Standing Expiration Date:   05/05/2021  . CMP (Cancer Center only)    Standing Status:   Future    Number of Occurrences:   1    Standing Expiration Date:   05/05/2021  . Protein C activity    Standing Status:   Future    Number of Occurrences:   1    Standing Expiration Date:   05/05/2021  . Protein C, total    Standing Status:   Future    Number of Occurrences:   1    Standing Expiration Date:   05/05/2021  . Protein S activity    Standing Status:   Future    Number of Occurrences:   1    Standing Expiration Date:   05/05/2021  . Protein S, total    Standing Status:   Future    Number of Occurrences:   1    Standing Expiration Date:   05/05/2021  . Lupus anticoagulant panel    Standing Status:   Future    Number of Occurrences:   1    Standing Expiration Date:   05/05/2021  . Beta-2-glycoprotein i abs, IgG/M/A    Standing Status:   Future    Number of Occurrences:   1    Standing Expiration Date:   05/05/2021  . Factor 5 leiden    Standing Status:   Future    Number of Occurrences:   1    Standing  Expiration Date:   05/05/2021  . Prothrombin gene mutation    Standing Status:   Future    Number of  Occurrences:   1    Standing Expiration Date:   05/05/2021  . Cardiolipin antibodies, IgG, IgM, IgA    Standing Status:   Future    Number of Occurrences:   1    Standing Expiration Date:   05/05/2021    All of the patients questions were answered with apparent satisfaction. The patient knows to call the clinic with any problems, questions or concerns.  I spent 30 minutes counseling the patient face to face. The total time spent in the appointment was 45 minutes and more than 50% was on counseling and direct patient cares.    Wyvonnia Lora MD MS AAHIVMS [facility] Hematology/Oncology Physician [facility]  (Office):       [phone] (Work cell):  [phone] (Fax):           [phone]  05/04/2020 12:45 PM  I, Minda Meo, am acting as scribe for Dr. Wyvonnia Lora, MD.   .I have reviewed the above documentation for accuracy and completeness, and I agree with the above. Johney Maine MD

## 2020-05-05 ENCOUNTER — Inpatient Hospital Stay: Payer: PRIVATE HEALTH INSURANCE

## 2020-05-05 ENCOUNTER — Other Ambulatory Visit: Payer: Self-pay

## 2020-05-05 ENCOUNTER — Inpatient Hospital Stay: Payer: PRIVATE HEALTH INSURANCE | Attending: Hematology | Admitting: Hematology

## 2020-05-05 VITALS — BP 123/71 | HR 76 | Temp 97.8°F | Resp 20 | Ht 68.0 in | Wt 176.0 lb

## 2020-05-05 DIAGNOSIS — D693 Immune thrombocytopenic purpura: Secondary | ICD-10-CM | POA: Diagnosis not present

## 2020-05-05 DIAGNOSIS — D6859 Other primary thrombophilia: Secondary | ICD-10-CM | POA: Diagnosis present

## 2020-05-05 LAB — CBC WITH DIFFERENTIAL/PLATELET
Abs Immature Granulocytes: 0.01 10*3/uL (ref 0.00–0.07)
Basophils Absolute: 0 10*3/uL (ref 0.0–0.1)
Basophils Relative: 1 %
Eosinophils Absolute: 0.1 10*3/uL (ref 0.0–0.5)
Eosinophils Relative: 2 %
HCT: 41.7 % (ref 36.0–46.0)
Hemoglobin: 13.7 g/dL (ref 12.0–15.0)
Immature Granulocytes: 0 %
Lymphocytes Relative: 46 %
Lymphs Abs: 2.6 10*3/uL (ref 0.7–4.0)
MCH: 31.1 pg (ref 26.0–34.0)
MCHC: 32.9 g/dL (ref 30.0–36.0)
MCV: 94.6 fL (ref 80.0–100.0)
Monocytes Absolute: 0.5 10*3/uL (ref 0.1–1.0)
Monocytes Relative: 9 %
Neutro Abs: 2.4 10*3/uL (ref 1.7–7.7)
Neutrophils Relative %: 42 %
Platelets: 264 10*3/uL (ref 150–400)
RBC: 4.41 MIL/uL (ref 3.87–5.11)
RDW: 12.5 % (ref 11.5–15.5)
WBC: 5.6 10*3/uL (ref 4.0–10.5)
nRBC: 0 % (ref 0.0–0.2)

## 2020-05-05 LAB — CMP (CANCER CENTER ONLY)
ALT: 14 U/L (ref 0–44)
AST: 14 U/L — ABNORMAL LOW (ref 15–41)
Albumin: 4.5 g/dL (ref 3.5–5.0)
Alkaline Phosphatase: 44 U/L (ref 38–126)
Anion gap: 7 (ref 5–15)
BUN: 11 mg/dL (ref 6–20)
CO2: 27 mmol/L (ref 22–32)
Calcium: 9.3 mg/dL (ref 8.9–10.3)
Chloride: 105 mmol/L (ref 98–111)
Creatinine: 0.79 mg/dL (ref 0.44–1.00)
GFR, Estimated: >60 mL/min (ref >60)
Glucose, Bld: 103 mg/dL — ABNORMAL HIGH (ref 70–99)
Potassium: 4 mmol/L (ref 3.5–5.1)
Sodium: 139 mmol/L (ref 135–145)
Total Bilirubin: 0.6 mg/dL (ref 0.3–1.2)
Total Protein: 7.6 g/dL (ref 6.5–8.1)

## 2020-05-05 NOTE — Patient Instructions (Signed)
Thank you for choosing [facility] to provide your oncology and hematology care.   Should you have questions after your visit to the [facility] ([facility]), please contact this office at [phone] between 8:30 AM and 4:30 PM.  Voice mails left after 4:00 PM may not be returned until the following business day.  Calls received after 4:30 PM will be answered by an off-site Nurse Triage Line.    Prescription Refills:  Please have your pharmacy contact us directly for most prescription requests.  Contact the office directly for refills of narcotics (pain medications). Allow 48-72 hours for refills.  Appointments: Please contact the [facility] scheduling department [phone] for questions regarding [facility] appointment scheduling.  Contact the schedulers with any scheduling changes so that your appointment can be rescheduled in a timely manner.   Central Scheduling for [place] [phone] - Call to schedule procedures such as PET scans, CT scans, MRI, Ultrasound, etc.  To afford each patient quality time with our providers, please arrive 30 minutes before your scheduled appointment time.  If you arrive late for your appointment, you may be asked to reschedule.  We strive to give you quality time with our providers, and arriving late affects you and other patients whose appointments are after yours. If you are a no show for multiple scheduled visits, you may be dismissed from the clinic at the providers discretion.     Resources: [facility] Social Workers [phone] for additional information on assistance programs or assistance connecting with community support programs   [place] DSS  [phone]: Information regarding food stamps, Medicaid, and utility assistance [place] [place] [phone]   [place] [employer]'s shared-ride transportation service for eligible riders who have a disability that prevents them from riding the fixed route bus.   Medicare  Rights Center 800-333-4114 Helps people with Medicare understand their rights and benefits, navigate the Medicare system, and secure the quality healthcare they deserve [employer] 800-227-2345 Assists patients locate various types of support and financial assistance Cancer Care: 1-800-813-HOPE (4673) Provides financial assistance, online support groups, medication/co-pay assistance.   Transportation Assistance for appointments at [facility]: Transportation Coordinator [phone]  Again, thank you for choosing  Cancer Center for your care.       

## 2020-05-06 LAB — CARDIOLIPIN ANTIBODIES, IGG, IGM, IGA
Anticardiolipin IgA: <9 APL U/mL (ref 0–11)
Anticardiolipin IgG: <9 GPL U/mL (ref 0–14)
Anticardiolipin IgM: <9 MPL U/mL (ref 0–12)

## 2020-05-06 LAB — BETA-2-GLYCOPROTEIN I ABS, IGG/M/A
Beta-2 Glyco I IgG: <9 GPI IgG units (ref 0–20)
Beta-2-Glycoprotein I IgA: <9 GPI IgA units (ref 0–25)
Beta-2-Glycoprotein I IgM: <9 GPI IgM units (ref 0–32)

## 2020-05-06 LAB — LUPUS ANTICOAGULANT PANEL
DRVVT: 34.6 s (ref 0.0–47.0)
PTT Lupus Anticoagulant: 31.1 s (ref 0.0–51.9)

## 2020-05-06 LAB — PROTEIN C ACTIVITY: Protein C Activity: 121 % (ref 73–180)

## 2020-05-06 LAB — PROTEIN S ACTIVITY: Protein S Activity: 21 % — ABNORMAL LOW (ref 63–140)

## 2020-05-06 LAB — PROTEIN S, TOTAL: Protein S Ag, Total: 48 % — ABNORMAL LOW (ref 60–150)

## 2020-05-07 LAB — PROTEIN C, TOTAL: Protein C, Total: 116 % (ref 60–150)

## 2020-05-10 LAB — ANTITHROMBIN PANEL
AT III AG PPP IMM-ACNC: 93 % (ref 72–124)
Antithrombin Activity: 114 % (ref 75–135)

## 2020-05-11 LAB — FACTOR 5 LEIDEN

## 2020-05-11 LAB — PROTHROMBIN GENE MUTATION

## 2020-05-17 ENCOUNTER — Other Ambulatory Visit: Payer: Self-pay

## 2020-05-17 ENCOUNTER — Ambulatory Visit (INDEPENDENT_AMBULATORY_CARE_PROVIDER_SITE_OTHER): Payer: PRIVATE HEALTH INSURANCE | Admitting: Gastroenterology

## 2020-05-17 ENCOUNTER — Encounter: Payer: Self-pay | Admitting: Gastroenterology

## 2020-05-17 VITALS — BP 112/70 | HR 69 | Ht 68.0 in | Wt 176.0 lb

## 2020-05-17 DIAGNOSIS — R110 Nausea: Secondary | ICD-10-CM

## 2020-05-17 DIAGNOSIS — R140 Abdominal distension (gaseous): Secondary | ICD-10-CM | POA: Diagnosis not present

## 2020-05-17 DIAGNOSIS — K5904 Chronic idiopathic constipation: Secondary | ICD-10-CM | POA: Diagnosis not present

## 2020-05-17 MED ORDER — PANTOPRAZOLE SODIUM 40 MG PO TBEC: 40.0000 mg | DELAYED_RELEASE_TABLET | Freq: Every day | ORAL | 2 refills | Status: DC

## 2020-05-17 NOTE — Progress Notes (Signed)
History of Present Illness: This is a 25 year old female referred by Nche, Bonna Gains, NP for the evaluation of constipation, lower abdominal bloating, nausea, anorexia.  She relates problems with constipation for many years.  Over the past few years she generally had a bowel movement about once per week and frequently requires a laxative.  She began taking Colace and more water and she is having bowel movements about every 3 to 4 days but probably not complete bowel movements.  She notes lower abdominal bloating as well.  For at least 7 years she has had intermittent difficulties with nausea during a meal or right before a meal.  This symptom has become more frequent over the past month and are now associated with a loss of appetite.  She is able to tolerate liquids and soft foods during these times but not solid foods.  She has had an intentional 30 pound weight loss over the past year. RUQ [place] in January was unremarkable.  She carries a diagnosis of protein S deficiency. CBC and CMP in January were unremarkable. Denies diarrhea, change in stool caliber, melena, hematochezia,  vomiting, dysphagia, chest pain.    No Known Allergies Outpatient Medications Prior to Visit  Medication Sig Dispense Refill  . clonazePAM (KLONOPIN) 0.5 MG tablet Take 0.5 mg by mouth every 30 (thirty) days. Pt states she takes monthly as needed    . clotrimazole-betamethasone (LOTRISONE) cream clotrimazole-betamethasone 1 %-0.05 % topical cream  APPLY TO THE AFFECTED AND SURROUNDING AREAS OF SKIN BY TOPICAL ROUTE 2 TIMES PER DAY IN THE MORNING AND EVENING FOR 2 WEEKS    . docusate sodium (COLACE) 100 MG capsule Take 1-2 capsules (100-200 mg total) by mouth 2 (two) times daily.  0  . levonorgestrel (KYLEENA) 19.5 MG IUD Kyleena 17.5 mcg/24 hrs (65yrs) 19.5mg  intrauterine device  Take by intrauterine route.    . SUMAtriptan (IMITREX) 100 MG tablet Take 100 mg by mouth as needed. Pt states she takes this medication as  needed     No facility-administered medications prior to visit.   Past Medical History:  Diagnosis Date  . ITP (idiopathic thrombocytopenic purpura)   . Protein S deficiency [facility])    Past Surgical History:  Procedure Laterality Date  . TONSILLECTOMY     Social History   Socioeconomic History  . Marital status: Single    Spouse name: Not on file  . Number of children: Not on file  . Years of education: Not on file  . Highest education level: Not on file  Occupational History  . Not on file  Tobacco Use  . Smoking status: Never Smoker  . Smokeless tobacco: Never Used  Vaping Use  . Vaping Use: Never used  Substance and Sexual Activity  . Alcohol use: Yes    Comment: social  . Drug use: Never  . Sexual activity: Not on file  Other Topics Concern  . Not on file  Social History Narrative  . Not on file   Social Determinants of Health   Financial Resource Strain: Not on file  Food Insecurity: Not on file  Transportation Needs: Not on file  Physical Activity: Not on file  Stress: Not on file  Social Connections: Not on file   Family History  Problem Relation Age of Onset  . Healthy Mother   . Clotting disorder Mother   . Healthy Father   . Hyperlipidemia Father   . Colon cancer Maternal Grandmother   . Diabetes Maternal Grandfather   .  Heart disease Maternal Grandfather   . Heart disease Paternal Grandfather   . Melanoma Paternal Grandfather      Review of Systems: Pertinent positive and negative review of systems were noted in the above HPI section. All other review of systems were otherwise negative.   Physical Exam: General: Well developed, well nourished, no acute distress Head: Normocephalic and atraumatic Eyes: Sclerae anicteric, EOMI Ears: Normal auditory acuity Mouth: Not examined, mask on during Covid-19 pandemic Neck: Supple, no masses or thyromegaly Lungs: Clear throughout to auscultation Heart: Regular rate and rhythm; no murmurs, rubs or  bruits Abdomen: Soft, lower abdominal / suprapubic tenderness and non distended. No masses, hepatosplenomegaly or hernias noted. Normal Bowel sounds Rectal: Not done Musculoskeletal: Symmetrical with no gross deformities  Skin: No lesions on visible extremities Pulses:  Normal pulses noted Extremities: No clubbing, cyanosis, edema or deformities noted Neurological: Alert oriented x 4, grossly nonfocal Cervical Nodes:  No significant cervical adenopathy Inguinal Nodes: No significant inguinal adenopathy Psychological:  Alert and cooperative. Normal mood and affect   Assessment and Recommendations:  1.  Chronic idiopathic constipation associated with lower abdominal bloating and lower abdominal tenderness.  Begin MiraLAX once every other day to once daily titrated for complete bowel movement daily or every other day.  If this is not effective in controlling her symptoms consider Linzess or Trulance next.  2.  Worsening nausea and loss of appetite.  Rule out gastritis, ulcer and GERD.  Trial of pantoprazole 40 mg daily for 1 month.  Avoid aspirin and NSAIDs.  If symptoms not resolved we will proceed with EGD.  3.  Protein S deficiency.  Followed by hematology.   cc: Anne Ng, NP [address] [place],  [place] [address]

## 2020-05-17 NOTE — Patient Instructions (Signed)
You can take Miralax 17 grams in 8 oz of water.   We have sent the following medications to your pharmacy for you to pick up at your convenience: pantoprazole.   Thank you for choosing me and [facility].  Venita Lick. Pleas Koch., MD., Clementeen Graham

## 2020-05-19 NOTE — Progress Notes (Signed)
HEMATOLOGY/ONCOLOGY CONSULTATION NOTE  Date of Service: 05/20/2020 Patient Care Team: Nche, Bonna Gains, NP as PCP - General (Internal Medicine)  CHIEF COMPLAINTS/PURPOSE OF CONSULTATION:  Protein S Deficiency  HISTORY OF PRESENTING ILLNESS:   Amanda Daugherty is a wonderful 25 y.o. female who has been referred to [place] by Dr. Arelia Sneddon, MD for evaluation and management of protein s deficiency. The pt reports that she is doing well overall.  The pt reports that she was referred by her OBGYN due to her mother's history of Protein S deficiency and coagulation disorder. Her mom was diagnosed with this in her 30s and it was discovered through her pregnancy.   The pt notes that she was personal diagnosed with ITP when she was 25 years old. This was a short term 1-2 year acute disease that is no longer been affecting the pt. The pt denies any history of blood clots and denies pregnancy. The pt has an IUD the last five years but denies any estrogen-containing birth controls.   She notes that she herself has never had personal hx of venous thrombo-embolism.  The pt notes that she received a complete hypercoagulation panel when she was 17 or 18, but is unsure of where nor the levels they instructed her.  The pt had a Tonsillectomy when she was 25 years old, with no complications or any major surgeries. The pt denies any stress factors that increase he risk of clotting.   The pt notes that she frequently travels long distance, as she used to live in [place]. The pt also notes her boyfriend lives in [place] and she frequently takes 8 hour flights every 2 months. The pt denies Aspirin usage and admits to using compression socks during.  The pt also notes that the pt was confirmed to have Endometriosis without doing the surgery. The pt also had an abnormal pap smear. She also notes that she had a Biopsy this morning.   The pt is unaware of any medication allergies. She takes Colace daily, Imitrex  and Klonopin prn. The pt denies chemical use and tobacco use, and only drinks alcohol socially. The pt notes that she currently works remotely as a Programmer, systems.  On review of systems, pt reports chronic headaches, Endometriosis and denies fevers, chills, night sweats, black/bloody stools, leg swelling, abdominal pain and any other symptoms.  INTERVAL HISTORY I connected with Amanda Daugherty on 05/20/2020 by telephone and verified that I am speaking with the correct person using two identifiers.   I discussed the limitations of evaluation and management by telemedicine. The patient expressed understanding and agreed to proceed.   Other persons participating in the visit and their role in the encounter:                                                         - Minda Meo, Medical Scribe                                                        - Sherren Mocha, pt's dad   Patient's location: Home Provider's location: [facility]  is a wonderful 25 y.o. female who is here today for evaluation and management of protein s deficiency. The patient's last visit with [place] was on 05/05/2020. The pt reports that she is doing well overall.  The pt reports no new symptoms or concerns. The pt's father notes that the pt's mother had blood clotting in both lungs following the birth (six-weeks postpartum) of their first child. She has been on Coumadin for the last thirty years. The pt's grandmother also has a protein S deficiency, with unknown severity in clotting. The pt's father notes that the only issue with her mother is the serious clots following pregnancy. They deny spontaneous clotting in both the pt's mother and grandmother. The pt's mother did have a miscarriage between her first and second child, during the first trimester.  Lab results 05/05/2020 of CBC w/diff and CMP is as follows: all values are WNL except Glucose of 104, AST of 14. 05/05/2020 Antithrombin activity  of 114, AT III AG PPP IMM-ACNC of 93. 05/05/2020 Anticardiolipin IgG of <9, IgM <9, IgA <9.  05/05/2020 Prothrombin gene mutation not detected. 05/05/2020 Beta-2 Glycoprotein IgG of <9, IgM <9, IgA <9. 05/05/2020 PTT Lupus Anticoagulant of 31.1, DRVVT of 34.6. No lupus anticoagulant detected. 05/05/2020 Protein S total of 48. 05/05/2020 Protein S activity of 21. 05/05/2020 Protein C total of 116. 05/05/2020 Protein C activity of 121.  05/05/2020 Factor 5 leiden not detected.  On review of systems, pt reports no acute symptoms.  MEDICAL HISTORY:  Past Medical History:  Diagnosis Date  . ITP (idiopathic thrombocytopenic purpura)   . Protein S deficiency ([facility])     SURGICAL HISTORY: Past Surgical History:  Procedure Laterality Date  . TONSILLECTOMY      SOCIAL HISTORY: Social History   Socioeconomic History  . Marital status: Single    Spouse name: Not on file  . Number of children: Not on file  . Years of education: Not on file  . Highest education level: Not on file  Occupational History  . Not on file  Tobacco Use  . Smoking status: Never Smoker  . Smokeless tobacco: Never Used  Vaping Use  . Vaping Use: Never used  Substance and Sexual Activity  . Alcohol use: Yes    Comment: social  . Drug use: Never  . Sexual activity: Not on file  Other Topics Concern  . Not on file  Social History Narrative  . Not on file   Social Determinants of Health   Financial Resource Strain: Not on file  Food Insecurity: Not on file  Transportation Needs: Not on file  Physical Activity: Not on file  Stress: Not on file  Social Connections: Not on file  Intimate Partner Violence: Not on file    FAMILY HISTORY: Family History  Problem Relation Age of Onset  . Healthy Mother   . Clotting disorder Mother   . Healthy Father   . Hyperlipidemia Father   . Colon cancer Maternal Grandmother   . Diabetes Maternal Grandfather   . Heart disease Maternal Grandfather   . Heart  disease Paternal Grandfather   . Melanoma Paternal Grandfather     ALLERGIES:  has No Known Allergies.  MEDICATIONS:  Current Outpatient Medications  Medication Sig Dispense Refill  . clonazePAM (KLONOPIN) 0.5 MG tablet Take 0.5 mg by mouth every 30 (thirty) days. Pt states she takes monthly as needed    . clotrimazole-betamethasone (LOTRISONE) cream clotrimazole-betamethasone 1 %-0.05 % topical cream  APPLY TO THE AFFECTED AND SURROUNDING  AREAS OF SKIN BY TOPICAL ROUTE 2 TIMES PER DAY IN THE MORNING AND EVENING FOR 2 WEEKS    . docusate sodium (COLACE) 100 MG capsule Take 1-2 capsules (100-200 mg total) by mouth 2 (two) times daily.  0  . levonorgestrel (KYLEENA) 19.5 MG IUD Kyleena 17.5 mcg/24 hrs (23yrs) 19.5mg  intrauterine device  Take by intrauterine route.    . pantoprazole (PROTONIX) 40 MG tablet Take 1 tablet (40 mg total) by mouth daily. 30 tablet 2  . SUMAtriptan (IMITREX) 100 MG tablet Take 100 mg by mouth as needed. Pt states she takes this medication as needed     No current facility-administered medications for this visit.    REVIEW OF SYSTEMS:   10 Point review of Systems was done is negative except as noted above.  PHYSICAL EXAMINATION: ECOG PERFORMANCE STATUS: 0 - Asymptomatic  There were no vitals filed for this visit. There were no vitals filed for this visit. .There is no height or weight on file to calculate BMI.  Telehealth Visit.  LABORATORY DATA:  I have reviewed the data as listed  CBC Latest Ref Rng & Units 05/05/2020 03/22/2020 04/08/2015  WBC 4.0 - 10.5 K/uL 5.6 6.2 11.7(H)  Hemoglobin 12.0 - 15.0 g/dL 14.9 70.2 63.7  Hematocrit 36.0 - 46.0 % 41.7 40.9 41.6  Platelets 150 - 400 K/uL 264 205.0 316    CMP Latest Ref Rng & Units 05/05/2020 03/22/2020 04/08/2015  Glucose 70 - 99 mg/dL 858(I) 89 90  BUN 6 - 20 mg/dL 11 11 12   Creatinine 0.44 - 1.00 mg/dL 5.02 7.74  Sodium 135 - 145 mmol/L 139 137 143  Potassium 3.5 - 5.1 mmol/L 4.0 3.7 3.8   Chloride 98 - 111 mmol/L 105 102 107  CO2 22 - 32 mmol/L 27 27 26   Calcium 8.9 - 10.3 mg/dL 9.3 9.1 9.9  Total Protein 6.5 - 8.1 g/dL 7.6 7.1 7.8  Total Bilirubin 0.3 - 1.2 mg/dL 0.6 0.8 0.7  Alkaline Phos 38 - 126 U/L 44 38(L) 54  AST 15 - 41 U/L 14(L) 14 20  ALT 0 - 44 U/L 14 13 41     RADIOGRAPHIC STUDIES: I have personally reviewed the radiological images as listed and agreed with the findings in the report. No results found.  ASSESSMENT & PLAN:   25 yo with   1) Family hx of Protein S deficiency in her mother No personal hx of VTE Has h/o endometriosis and wanted to be evaluated for hypercoagulable state prior to consideration of treatment options for endometriosis including use of estrogen containing birth control pills.  PLAN: -Discussed pt labs, 05/05/2020; blood counts and chemistries normal, antithrombin iii normal, factor 5 leiden no mutations, prothrombin gene show no mutations, Antiphospholipid antibodies all negative, Protein C normal. No suggestion of other clotting disorders outside of Protein S deficiency  -Advised pt she does have a Protein S deficiency and decreased activity levels. Thus, we would not want to consider estrogen-containing forms of birth control if at all possible, as this will trigger the underlying tendency of clotting. -Advised pt her total Protein S level were slightly low, while the activity levels were significantly reduced. Advised that the exact levels do not neccessarily correlate to risk of clotting.  -Advised pt that if the free Protein S levels are less than 33%, then it is more likely to cause clotting. The pt's level is in the 20s perecentile.  -Discussed the severity of Protein S and relation to significance in family.  Discussed pt's family hx. -Advised pt that postpartum state is an increased risk factor for clots.  -Advised pt that due to her hx of no clotting, we would not use prophylactic long term full dose blood  thinners. -Recommended pt avoid dehydration and practice precautions with long-distance travel. Emphasized importance of risk mitigation. -Advised pt that we would use preventative blood thinners during pregnancy and post-partum. Also do this with surgeries. -Advised pt that Prophylactic Anticoagulants should be used in addition to compression socks for long distance travel.  -Discussed potential of only option being estrogen-containing birth controls. Advised that there are no studies that show anticoagulants helping, but it is probably safer than not. This depends on duration, quantity of estrogen. Would use full dose blood thinners if they had to put her on this.  -Advised pt to avoid steroids. General vitamins are acceptable. Recommended staying physically active. -Discussed self-diagnosing clots and how to recognize them in the pt.  -Advised pt we will get a lab for free protein S level to establish baseline. No rush on this.  -Emphasized importance of pt informing OBGYN regarding prenatal counseling. -Will see back in 12 months with labs. Pt is aware to inform of travel or pregnancy or laparoscopic surgery.  FOLLOW UP: RTC w Dr Candise Che in 12 months with labs   No orders of the defined types were placed in this encounter.   All of the patients questions were answered with apparent satisfaction. The patient knows to call the clinic with any problems, questions or concerns.  The total time spent in the appointment was 20 minutes and more than 50% was on counseling and direct patient cares.    Wyvonnia Lora MD MS AAHIVMS [facility] Hematology/Oncology Physician [facility]  (Office):       [phone] (Work cell):  [phone] (Fax):           [phone]  05/20/2020 12:30 PM  I, Minda Meo, am acting as scribe for Dr. Wyvonnia Lora, MD.   .I have reviewed the above documentation for accuracy and completeness, and I agree with the above. Johney Maine MD

## 2020-05-20 ENCOUNTER — Inpatient Hospital Stay: Payer: PRIVATE HEALTH INSURANCE | Attending: Hematology | Admitting: Hematology

## 2020-05-20 DIAGNOSIS — D6859 Other primary thrombophilia: Secondary | ICD-10-CM | POA: Diagnosis not present

## 2020-06-02 NOTE — H&P (Signed)
Patient name Rya, Rausch DICTATION# [id number] CSN# [id number]  Richardean Chimera, MD 06/02/2020 7:06 AM

## 2020-06-03 NOTE — H&P (Signed)
NAME: Amanda Daugherty, Pyper MEDICAL RECORD NO: [id number] ACCOUNT NO: [id number] DATE OF BIRTH: Nov 28, 1995 PHYSICIAN: Juluis Mire, MD  History and Physical   DATE OF ADMISSION: 06/09/2020  Surgery is 06/09/2020 at [facility].  HISTORY OF PRESENT ILLNESS:  A 25 year old old gravida who presents for diagnostic laparoscopy.  The patient has been having trouble with increasing pain and discomfort, particularly with intercourse.  Previous ultrasound evaluations have been  unremarkable.  Potentially, we are dealing with endometriosis.  She does have an IUD in place.  Bleeding is not an issue.  She is found to have protein S deficiency.  ALLERGIES:  She has no known drug allergies.  MEDICATIONS:  She uses Imitrex as needed for headaches.  She has Klonopin that she uses as needed.  She has a [place] IUD in place.  PAST MEDICAL HISTORY:  Usual childhood diseases without any significant sequelae.  She does have a history of migraine headaches.  She is also being managed for protein S deficiency.  She is heterozygous for that gene.  Has seen the  hematologist/oncologist.  SOCIAL HISTORY:  No tobacco.  Occasional alcohol use.  FAMILY HISTORY:  Noncontributory.  REVIEW OF SYSTEMS:  Noncontributory.  PHYSICAL EXAMINATION: VITAL SIGNS:  The patient is afebrile with stable vital signs. HEENT:  The patient is normocephalic.  Pupils equal, round, reactive to light, and accommodation.  Extraocular movements are intact.  Sclerae and conjunctivae are clear.  Oropharynx clear. NECK:  Without thyromegaly. BREASTS:  Not examined. LUNGS:  Clear. CARDIOVASCULAR:  Regular rhythm and rate without murmurs or gallops. ABDOMEN:  Benign.  No masses, organomegaly, or tenderness. PELVIC:  Normal external genitalia.  Vaginal mucosa is clear.  Cervix is unremarkable.  IUD string is noted.  Uterus normal size, shape, and contour.  Adnexa free of masses or tenderness. EXTREMITIES:  Trace edema. NEUROLOGIC:   Grossly within normal limits.  IMPRESSION:  1.  Pelvic pain and associated dyspareunia, rule out pelvic causes such as endometriosis. 2.  Protein S deficiency.  PLAN:  The patient to undergo diagnostic laparoscopy.  The nature of the procedure has been discussed.  The risks have been explained.  Risks can include risk of infection.  Risk of vascular injury that could lead to hemorrhage requiring transfusion with  the risk of AIDS or hepatitis.  Risk of injury to adjacent organs such as bladder, bowel, or ureters that could require further exploratory surgery.  Risk of deep venous thrombosis and pulmonary embolus.  The patient expressed understanding of the  indications and risks.   [facility] D: 06/02/2020 7:05:39 am T: 06/02/2020 10:45:00 am  JOB: [id number]/ [id number]

## 2020-06-06 ENCOUNTER — Other Ambulatory Visit: Payer: Self-pay

## 2020-06-06 ENCOUNTER — Encounter (HOSPITAL_BASED_OUTPATIENT_CLINIC_OR_DEPARTMENT_OTHER): Payer: Self-pay | Admitting: Obstetrics and Gynecology

## 2020-06-06 ENCOUNTER — Other Ambulatory Visit (HOSPITAL_COMMUNITY)
Admission: RE | Admit: 2020-06-06 | Discharge: 2020-06-06 | Disposition: A | Discharge status: Home / Self Care | Payer: PRIVATE HEALTH INSURANCE | Source: Ambulatory Visit | Attending: Obstetrics and Gynecology | Admitting: Obstetrics and Gynecology

## 2020-06-06 DIAGNOSIS — Z01812 Encounter for preprocedural laboratory examination: Secondary | ICD-10-CM | POA: Diagnosis not present

## 2020-06-06 DIAGNOSIS — Z20822 Contact with and (suspected) exposure to covid-19: Secondary | ICD-10-CM | POA: Diagnosis not present

## 2020-06-06 LAB — SARS CORONAVIRUS 2 (TAT 6-24 HRS): SARS Coronavirus 2: NEGATIVE

## 2020-06-06 NOTE — Progress Notes (Signed)
Spoke w/ via phone for pre-op interview--- PT Lab needs dos--- CBC, Serum preg, T&S          Lab results------  no COVID test ------ done 06-06-2020 result in epic Arrive at -------  0530 on 06-09-2020 NPO after MN NO Solid Food.  Clear liquids from MN until--- 0430 Medications to take morning of surgery ----- NONE Diabetic medication ----- n/a Patient Special Instructions ----- n/a Pre-Op special Istructions ----- n/a Patient verbalized understanding of instructions that were given at this phone interview. Patient denies shortness of breath, chest pain, fever, cough at this phone interview.

## 2020-06-08 ENCOUNTER — Encounter (HOSPITAL_BASED_OUTPATIENT_CLINIC_OR_DEPARTMENT_OTHER): Payer: Self-pay | Admitting: Obstetrics and Gynecology

## 2020-06-08 NOTE — Anesthesia Preprocedure Evaluation (Addendum)
Anesthesia Evaluation  Patient identified by MRN, date of birth, ID band Patient awake    Reviewed: Allergy & Precautions, NPO status , Patient's Chart, lab work & pertinent test results  Airway Mallampati: I  TM Distance: >3 FB Neck ROM: Full    Dental no notable dental hx. (+) Teeth Intact Upper retainer:   Pulmonary neg pulmonary ROS,    Pulmonary exam normal breath sounds clear to auscultation       Cardiovascular negative cardio ROS Normal cardiovascular exam Rhythm:Regular Rate:Normal     Neuro/Psych  Headaches, Anxiety    GI/Hepatic Neg liver ROS, Constipation    Endo/Other    Renal/GU negative Renal ROS  negative genitourinary   Musculoskeletal negative musculoskeletal ROS (+)   Abdominal   Peds  Hematology Hx/o ITP Protein S deficiency- no hx/o clots   Anesthesia Other Findings   Reproductive/Obstetrics Pelvic pain                           Anesthesia Physical Anesthesia Plan  ASA: II  Anesthesia Plan: General   Post-op Pain Management:    Induction:   PONV Risk Score and Plan: 4 or greater and Treatment may vary due to age or medical condition, Scopolamine patch - Pre-op, Midazolam, Dexamethasone, Ondansetron and Amisulpride  Airway Management Planned: Oral ETT  Additional Equipment:   Intra-op Plan:   Post-operative Plan: Extubation in OR  Informed Consent: I have reviewed the patients History and Physical, chart, labs and discussed the procedure including the risks, benefits and alternatives for the proposed anesthesia with the patient or authorized representative who has indicated his/her understanding and acceptance.     Dental advisory given  Plan Discussed with: CRNA and Anesthesiologist  Anesthesia Plan Comments:        Anesthesia Quick Evaluation

## 2020-06-09 ENCOUNTER — Encounter (HOSPITAL_BASED_OUTPATIENT_CLINIC_OR_DEPARTMENT_OTHER): Payer: Self-pay | Admitting: Obstetrics and Gynecology

## 2020-06-09 ENCOUNTER — Encounter (HOSPITAL_BASED_OUTPATIENT_CLINIC_OR_DEPARTMENT_OTHER)
Admission: RE | Disposition: A | Discharge status: Home / Self Care | Payer: Self-pay | Source: Home / Self Care | Attending: Obstetrics and Gynecology

## 2020-06-09 ENCOUNTER — Ambulatory Visit (HOSPITAL_BASED_OUTPATIENT_CLINIC_OR_DEPARTMENT_OTHER): Payer: PRIVATE HEALTH INSURANCE | Admitting: Anesthesiology

## 2020-06-09 ENCOUNTER — Ambulatory Visit (HOSPITAL_BASED_OUTPATIENT_CLINIC_OR_DEPARTMENT_OTHER)
Admission: RE | Admit: 2020-06-09 | Discharge: 2020-06-09 | Disposition: A | Discharge status: Home / Self Care | Payer: PRIVATE HEALTH INSURANCE | Attending: Obstetrics and Gynecology | Admitting: Obstetrics and Gynecology

## 2020-06-09 ENCOUNTER — Other Ambulatory Visit: Payer: Self-pay

## 2020-06-09 DIAGNOSIS — N803 Endometriosis of pelvic peritoneum: Secondary | ICD-10-CM | POA: Insufficient documentation

## 2020-06-09 DIAGNOSIS — N941 Unspecified dyspareunia: Secondary | ICD-10-CM | POA: Insufficient documentation

## 2020-06-09 DIAGNOSIS — N809 Endometriosis, unspecified: Secondary | ICD-10-CM | POA: Insufficient documentation

## 2020-06-09 DIAGNOSIS — D6859 Other primary thrombophilia: Secondary | ICD-10-CM | POA: Diagnosis not present

## 2020-06-09 DIAGNOSIS — R102 Pelvic and perineal pain: Secondary | ICD-10-CM | POA: Diagnosis not present

## 2020-06-09 HISTORY — DX: Chronic idiopathic constipation: K59.04

## 2020-06-09 HISTORY — DX: Migraine, unspecified, not intractable, without status migrainosus: G43.909

## 2020-06-09 HISTORY — DX: Personal history of diseases of the blood and blood-forming organs and certain disorders involving the immune mechanism: Z86.2

## 2020-06-09 HISTORY — DX: Pelvic and perineal pain: R10.2

## 2020-06-09 HISTORY — PX: LAPAROSCOPY: SHX197

## 2020-06-09 HISTORY — DX: Family history of diseases of the blood and blood-forming organs and certain disorders involving the immune mechanism: Z83.2

## 2020-06-09 LAB — TYPE AND SCREEN
ABO/RH(D): B POS
Antibody Screen: NEGATIVE

## 2020-06-09 LAB — CBC
HCT: 42.5 % (ref 36.0–46.0)
Hemoglobin: 14.3 g/dL (ref 12.0–15.0)
MCH: 31 pg (ref 26.0–34.0)
MCHC: 33.6 g/dL (ref 30.0–36.0)
MCV: 92 fL (ref 80.0–100.0)
Platelets: 229 10*3/uL (ref 150–400)
RBC: 4.62 MIL/uL (ref 3.87–5.11)
RDW: 11.8 % (ref 11.5–15.5)
WBC: 6.5 10*3/uL (ref 4.0–10.5)
nRBC: 0 % (ref 0.0–0.2)

## 2020-06-09 LAB — HCG, SERUM, QUALITATIVE: Preg, Serum: NEGATIVE

## 2020-06-09 LAB — ABO/RH: ABO/RH(D): B POS

## 2020-06-09 SURGERY — LAPAROSCOPY, DIAGNOSTIC
Anesthesia: General | Site: Abdomen

## 2020-06-09 MED ORDER — OXYCODONE HCL 5 MG PO TABS
ORAL_TABLET | ORAL | Status: AC
  Filled 2020-06-09: qty 1

## 2020-06-09 MED ORDER — PROPOFOL 10 MG/ML IV BOLUS
INTRAVENOUS | Status: AC
  Filled 2020-06-09: qty 20

## 2020-06-09 MED ORDER — LIDOCAINE HCL (PF) 2 % IJ SOLN
INTRAMUSCULAR | Status: AC
  Filled 2020-06-09: qty 5

## 2020-06-09 MED ORDER — LIDOCAINE 2% (20 MG/ML) 5 ML SYRINGE
INTRAMUSCULAR | Status: DC | PRN
  Administered 2020-06-09: 60 mg via INTRAVENOUS

## 2020-06-09 MED ORDER — POVIDONE-IODINE 10 % EX SWAB: 2.0000 "application " | Freq: Once | CUTANEOUS | Status: DC

## 2020-06-09 MED ORDER — OXYCODONE HCL 5 MG/5ML PO SOLN: 5.0000 mg | Freq: Once | ORAL | Status: AC | PRN

## 2020-06-09 MED ORDER — DEXMEDETOMIDINE (PRECEDEX) IN NS 20 MCG/5ML (4 MCG/ML) IV SYRINGE
PREFILLED_SYRINGE | INTRAVENOUS | Status: DC | PRN
  Administered 2020-06-09 (×2): 4 ug via INTRAVENOUS

## 2020-06-09 MED ORDER — LACTATED RINGERS IV SOLN: INTRAVENOUS | Status: DC

## 2020-06-09 MED ORDER — BUPIVACAINE HCL 0.25 % IJ SOLN
INTRAMUSCULAR | Status: DC | PRN
  Administered 2020-06-09: 7 mL

## 2020-06-09 MED ORDER — CEFAZOLIN SODIUM-DEXTROSE 2-4 GM/100ML-% IV SOLN
2.0000 g | INTRAVENOUS | Status: AC
  Administered 2020-06-09: 2 g via INTRAVENOUS

## 2020-06-09 MED ORDER — PROPOFOL 10 MG/ML IV BOLUS
INTRAVENOUS | Status: DC | PRN
  Administered 2020-06-09: 180 mg via INTRAVENOUS

## 2020-06-09 MED ORDER — DEXAMETHASONE SODIUM PHOSPHATE 10 MG/ML IJ SOLN
INTRAMUSCULAR | Status: DC | PRN
  Administered 2020-06-09: 10 mg via INTRAVENOUS

## 2020-06-09 MED ORDER — ACETAMINOPHEN 10 MG/ML IV SOLN
INTRAVENOUS | Status: AC
  Filled 2020-06-09: qty 100

## 2020-06-09 MED ORDER — FENTANYL CITRATE (PF) 100 MCG/2ML IJ SOLN
INTRAMUSCULAR | Status: AC
  Filled 2020-06-09: qty 2

## 2020-06-09 MED ORDER — HEPARIN SODIUM (PORCINE) 5000 UNIT/ML IJ SOLN
INTRAMUSCULAR | Status: AC
  Filled 2020-06-09: qty 1

## 2020-06-09 MED ORDER — OXYCODONE HCL 5 MG PO TABS
5.0000 mg | ORAL_TABLET | Freq: Once | ORAL | Status: AC | PRN
Start: 2020-06-09 — End: 2020-06-09
  Administered 2020-06-09: 5 mg via ORAL

## 2020-06-09 MED ORDER — ONDANSETRON HCL 4 MG/2ML IJ SOLN
INTRAMUSCULAR | Status: DC | PRN
  Administered 2020-06-09: 4 mg via INTRAVENOUS

## 2020-06-09 MED ORDER — DEXMEDETOMIDINE (PRECEDEX) IN NS 20 MCG/5ML (4 MCG/ML) IV SYRINGE
PREFILLED_SYRINGE | INTRAVENOUS | Status: AC
  Filled 2020-06-09: qty 5

## 2020-06-09 MED ORDER — FENTANYL CITRATE (PF) 100 MCG/2ML IJ SOLN
25.0000 ug | INTRAMUSCULAR | Status: DC | PRN
Start: 2020-06-09 — End: 2020-06-09
  Administered 2020-06-09 (×3): 25 ug via INTRAVENOUS

## 2020-06-09 MED ORDER — MIDAZOLAM HCL 2 MG/2ML IJ SOLN
INTRAMUSCULAR | Status: DC | PRN
  Administered 2020-06-09: 2 mg via INTRAVENOUS

## 2020-06-09 MED ORDER — SCOPOLAMINE 1 MG/3DAYS TD PT72
MEDICATED_PATCH | TRANSDERMAL | Status: AC
  Filled 2020-06-09: qty 1

## 2020-06-09 MED ORDER — HEPARIN SODIUM (PORCINE) 5000 UNIT/ML IJ SOLN
5000.0000 [IU] | INTRAMUSCULAR | Status: AC
  Administered 2020-06-09: 5000 [IU] via SUBCUTANEOUS

## 2020-06-09 MED ORDER — FENTANYL CITRATE (PF) 100 MCG/2ML IJ SOLN
INTRAMUSCULAR | Status: DC | PRN
  Administered 2020-06-09 (×2): 50 ug via INTRAVENOUS

## 2020-06-09 MED ORDER — SUGAMMADEX SODIUM 200 MG/2ML IV SOLN
INTRAVENOUS | Status: DC | PRN
  Administered 2020-06-09: 200 mg via INTRAVENOUS

## 2020-06-09 MED ORDER — MIDAZOLAM HCL 2 MG/2ML IJ SOLN
INTRAMUSCULAR | Status: AC
  Filled 2020-06-09: qty 2

## 2020-06-09 MED ORDER — ONDANSETRON HCL 4 MG/2ML IJ SOLN: 4.0000 mg | Freq: Once | INTRAMUSCULAR | Status: DC | PRN

## 2020-06-09 MED ORDER — AMISULPRIDE (ANTIEMETIC) 5 MG/2ML IV SOLN: 10.0000 mg | Freq: Once | INTRAVENOUS | Status: DC | PRN

## 2020-06-09 MED ORDER — SCOPOLAMINE 1 MG/3DAYS TD PT72
1.0000 | MEDICATED_PATCH | TRANSDERMAL | Status: DC
  Administered 2020-06-09: 1.5 mg via TRANSDERMAL

## 2020-06-09 MED ORDER — KETOROLAC TROMETHAMINE 30 MG/ML IJ SOLN
INTRAMUSCULAR | Status: DC | PRN
  Administered 2020-06-09: 30 mg via INTRAVENOUS

## 2020-06-09 MED ORDER — CEFAZOLIN SODIUM-DEXTROSE 2-4 GM/100ML-% IV SOLN
INTRAVENOUS | Status: AC
  Filled 2020-06-09: qty 100

## 2020-06-09 MED ORDER — ACETAMINOPHEN 10 MG/ML IV SOLN
INTRAVENOUS | Status: DC | PRN
  Administered 2020-06-09: 1000 mg via INTRAVENOUS

## 2020-06-09 MED ORDER — ROCURONIUM BROMIDE 10 MG/ML (PF) SYRINGE
PREFILLED_SYRINGE | INTRAVENOUS | Status: DC | PRN
  Administered 2020-06-09: 50 mg via INTRAVENOUS

## 2020-06-09 SURGICAL SUPPLY — 45 items
ADH SKN CLS APL DERMABOND .7 (GAUZE/BANDAGES/DRESSINGS) ×1
APL SWBSTK 6 STRL LF DISP (MISCELLANEOUS)
APPLICATOR COTTON TIP 6 STRL (MISCELLANEOUS) IMPLANT
APPLICATOR COTTON TIP 6IN STRL (MISCELLANEOUS) IMPLANT
BAG RETRIEVAL 10 (BASKET)
CANISTER SUCT 1200ML W/VALVE (MISCELLANEOUS) IMPLANT
CANISTER SUCT 3000ML PPV (MISCELLANEOUS) IMPLANT
CATH ROBINSON RED A/P 16FR (CATHETERS) ×2 IMPLANT
COVER MAYO STAND STRL (DRAPES) ×2 IMPLANT
COVER WAND RF STERILE (DRAPES) ×2 IMPLANT
DERMABOND ADVANCED (GAUZE/BANDAGES/DRESSINGS) ×1
DERMABOND ADVANCED .7 DNX12 (GAUZE/BANDAGES/DRESSINGS) ×1 IMPLANT
DRSG COVADERM PLUS 2X2 (GAUZE/BANDAGES/DRESSINGS) IMPLANT
DURAPREP 26ML APPLICATOR (WOUND CARE) ×2 IMPLANT
GAUZE 4X4 16PLY RFD (DISPOSABLE) ×2 IMPLANT
GLOVE SURG ENC MOIS LTX SZ7 (GLOVE) ×4 IMPLANT
GLOVE SURG UNDER POLY LF SZ7 (GLOVE) ×2 IMPLANT
GLOVE SURG UNDER POLY LF SZ7.5 (GLOVE) ×1 IMPLANT
GOWN STRL REUS W/ TWL LRG LVL3 (GOWN DISPOSABLE) IMPLANT
GOWN STRL REUS W/TWL LRG LVL3 (GOWN DISPOSABLE) ×2
GOWN STRL REUS W/TWL XL LVL3 (GOWN DISPOSABLE) ×2 IMPLANT
KIT TURNOVER CYSTO (KITS) ×2 IMPLANT
NEEDLE INSUFFLATION 120MM (ENDOMECHANICALS) IMPLANT
NS IRRIG 500ML POUR BTL (IV SOLUTION) ×2 IMPLANT
PACK LAPAROSCOPY BASIN (CUSTOM PROCEDURE TRAY) ×2 IMPLANT
PAD OB MATERNITY 4.3X12.25 (PERSONAL CARE ITEMS) ×2 IMPLANT
PAD PREP 24X48 CUFFED NSTRL (MISCELLANEOUS) ×2 IMPLANT
SCISSORS LAP 5X45 EPIX DISP (ENDOMECHANICALS) IMPLANT
SEALER TISSUE G2 CVD JAW 45CM (ENDOMECHANICALS) IMPLANT
SET IRRIG Y TYPE TUR BLADDER L (SET/KITS/TRAYS/PACK) IMPLANT
SET SUCTION IRRIG HYDROSURG (IRRIGATION / IRRIGATOR) IMPLANT
SET TUBE SMOKE EVAC HIGH FLOW (TUBING) ×2 IMPLANT
SUT VIC AB 3-0 PS2 18 (SUTURE) ×2
SUT VIC AB 3-0 PS2 18XBRD (SUTURE) ×1 IMPLANT
SUT VIC AB 4-0 PS2 18 (SUTURE) IMPLANT
SUT VICRYL 0 ENDOLOOP (SUTURE) IMPLANT
SUT VICRYL 0 UR6 27IN ABS (SUTURE) IMPLANT
SYS BAG RETRIEVAL 10MM (BASKET)
SYSTEM BAG RETRIEVAL 10MM (BASKET) IMPLANT
TOWEL OR 17X26 10 PK STRL BLUE (TOWEL DISPOSABLE) ×3 IMPLANT
TROCAR BALLN 12MMX100 BLUNT (TROCAR) IMPLANT
TROCAR OPTI TIP 5M 100M (ENDOMECHANICALS) ×2 IMPLANT
TROCAR XCEL DIL TIP R 11M (ENDOMECHANICALS) IMPLANT
WARMER LAPAROSCOPE (MISCELLANEOUS) ×2 IMPLANT
WATER STERILE IRR 500ML POUR (IV SOLUTION) ×2 IMPLANT

## 2020-06-09 NOTE — H&P (Signed)
  History and physical exam unchanged 

## 2020-06-09 NOTE — Anesthesia Postprocedure Evaluation (Signed)
Anesthesia Post Note  Patient: Amanda Daugherty  Procedure(s) Performed: LAPAROSCOPY DIAGNOSTIC, CAUTERY OF ENDOMETRIOSIS (N/A Abdomen)     Patient location during evaluation: PACU Anesthesia Type: General Level of consciousness: awake and alert Pain management: pain level controlled Vital Signs Assessment: post-procedure vital signs reviewed and stable Respiratory status: spontaneous breathing, nonlabored ventilation and respiratory function stable Cardiovascular status: blood pressure returned to baseline and stable Postop Assessment: no apparent nausea or vomiting Anesthetic complications: no   No complications documented.  Last Vitals:  Vitals:   06/09/20 0900 06/09/20 0915  BP: 113/72 110/68  Pulse: 63 67  Resp: 12 13  Temp:    SpO2: 98% 99%    Last Pain:  Vitals:   06/09/20 0915  TempSrc:   PainSc: 4                  Hoyte Ziebell A.

## 2020-06-09 NOTE — Transfer of Care (Signed)
Immediate Anesthesia Transfer of Care Note  Patient: Amanda Daugherty  Procedure(s) Performed: Procedure(s) (LRB): LAPAROSCOPY DIAGNOSTIC, CAUTERY OF ENDOMETRIOSIS (N/A)  Patient Location: PACU  Anesthesia Type: General  Level of Consciousness: awake, alert  and oriented  Airway & Oxygen Therapy: Patient Spontanous Breathing and Patient connected to nasal cannula oxygen  Post-op Assessment: Report given to PACU RN and Post -op Vital signs reviewed and stable  Post vital signs: Reviewed and stable  Complications: No apparent anesthesia complications  Last Vitals:  Vitals Value Taken Time  BP 120/80 06/09/20 0830  Temp    Pulse 88 06/09/20 0832  Resp 17 06/09/20 0832  SpO2 100 % 06/09/20 0832  Vitals shown include unvalidated device data.  Last Pain:  Vitals:   06/09/20 0603  TempSrc: Oral  PainSc: 6       Patients Stated Pain Goal: 6 (06/09/20 0603)  Complications: No complications documented.

## 2020-06-09 NOTE — Anesthesia Procedure Notes (Signed)
Procedure Name: Intubation Date/Time: 06/09/2020 7:34 AM Performed by: Mechele Claude, CRNA Pre-anesthesia Checklist: Patient identified, Emergency Drugs available, Suction available and Patient being monitored Patient Re-evaluated:Patient Re-evaluated prior to induction Oxygen Delivery Method: Circle system utilized Preoxygenation: Pre-oxygenation with 100% oxygen Induction Type: IV induction Ventilation: Mask ventilation without difficulty Laryngoscope Size: Mac and 3 Tube type: Oral Tube size: 7.0 mm Number of attempts: 1 Airway Equipment and Method: Stylet and Oral airway Placement Confirmation: ETT inserted through vocal cords under direct vision,  positive ETCO2 and breath sounds checked- equal and bilateral Secured at: 21 cm Tube secured with: Tape Dental Injury: Teeth and Oropharynx as per pre-operative assessment

## 2020-06-09 NOTE — Brief Op Note (Signed)
06/09/2020  8:47 AM  PATIENT:  Amanda Daugherty  25 y.o. female  PRE-OPERATIVE DIAGNOSIS:  PELVIC PAIN  POST-OPERATIVE DIAGNOSIS:  PELVIC PAIN, ENDOMETRIOSIS  PROCEDURE:  Procedure(s): LAPAROSCOPY DIAGNOSTIC, CAUTERY OF ENDOMETRIOSIS (N/A)  SURGEON:  Surgeon(s) and Role:    * Richardean Chimera, MD - Primary  PHYSICIAN ASSISTANT:   ASSISTANTS: none   ANESTHESIA:   local and general  EBL:  10 mL   BLOOD ADMINISTERED:none  DRAINS: none   LOCAL MEDICATIONS USED:  XYLOCAINE   SPECIMEN:  No Specimen  DISPOSITION OF SPECIMEN:  N/A  COUNTS:  YES  TOURNIQUET:  * No tourniquets in log *  DICTATION: .Other Dictation: Dictation Number [id number]  PLAN OF CARE: Discharge to home after PACU  PATIENT DISPOSITION:  PACU - hemodynamically stable.   Delay start of Pharmacological VTE agent (>24hrs) due to surgical blood loss or risk of bleeding: not applicable

## 2020-06-09 NOTE — Discharge Instructions (Signed)

## 2020-06-09 NOTE — Op Note (Signed)
Amanda Daugherty, MESMER MEDICAL RECORD NO: [id number] ACCOUNT NO: [id number] DATE OF BIRTH: [birth year] FACILITY: [facility] LOCATION: [facility] PHYSICIAN: Juluis Mire, MD  Operative Report   DATE OF PROCEDURE: 06/09/2020  PREOPERATIVE DIAGNOSIS:  Pelvic pain.  POSTOPERATIVE DIAGNOSES:  Pelvic pain with finding of mild pelvic endometriosis.  OPERATIVE PROCEDURE:  Diagnostic laparoscopy with cautery of endometriotic implants.  SURGEON:  Juluis Mire, MD  ANESTHESIA:  General endotracheal.  ESTIMATED BLOOD LOSS:  Minimal.  PACKS AND DRAINS:  None.  INTRAOPERATIVE BLOOD REPLACEMENT:  None.  COMPLICATIONS:  None.  INDICATIONS:  Dictated in history and physical.  DESCRIPTION OF PROCEDURE:  The patient was taken to the OR and placed in supine position.  After satisfactory level of general endotracheal anesthesia was obtained, she was placed in the dorsal lithotomy position using the Allen stirrups.  Perineum and  vagina were prepped out of Betadine.  Bladder was entered without catheterization.  A Kahn cannula was put in place along with a single tooth tenaculum and secured.  Abdomen was prepped with DuraPrep.  After a period of time, she was draped as a sterile field.  A subumbilical incision was made with a knife.  The Veress needle was introduced into the abdominal cavity.  Abdomen was inflated with approximately 3 liters  of carbon dioxide.  Operative laparoscope was then introduced.  Visualization revealed no evidence of injury to adjacent organs.  A 5 mm trocar was put in place in the suprapubic area under direct visualization.  Visualization of the abdomen revealed a  normal upper abdomen including liver, tip of the gallbladder.  Both lateral gutters were clear including the appendix.  Uterus was normal size and shape.  Tubes and ovaries were unremarkable.  She had several mild implants of endometriosis on the bladder  flap.  Looking posteriorly, she had implants of  endometriosis along the right uterosacral ligament and along the left pelvic sidewall.  There were no adhesions or scarring.  Both ovaries appeared to be normal along with both fallopian tubes.  Using the  bipolar unit, every area of endometriosis was adequately cauterized.  At the end of the procedure, all areas of endometriosis were cauterized.  There were no signs of any complications or injury to adjacent organs.  At this point in time, laparoscope was  then removed.  The abdomen was deflated of its carbon dioxide.  All trocars were removed.  Subumbilical incision was closed with interrupted subcuticular of 4-0 Vicryl.  The suprapubic incision was closed with Dermabond.  The Kahn cannula and single  tooth tenaculum were then removed.  The patient was taken out of the dorsal lithotomy position.  Once alert and extubated, she was transferred to recovery room in good condition.   [facility] D: 06/09/2020 8:45:05 am T: 06/09/2020 10:23:00 am  JOB: [id number]/ [id number]

## 2020-06-10 ENCOUNTER — Encounter (HOSPITAL_BASED_OUTPATIENT_CLINIC_OR_DEPARTMENT_OTHER): Payer: Self-pay | Admitting: Obstetrics and Gynecology

## 2020-06-14 ENCOUNTER — Ambulatory Visit: Payer: PRIVATE HEALTH INSURANCE | Admitting: Gastroenterology

## 2020-07-04 ENCOUNTER — Encounter: Payer: Self-pay | Admitting: Hematology

## 2020-07-05 ENCOUNTER — Other Ambulatory Visit: Payer: Self-pay | Admitting: Hematology

## 2020-07-05 MED ORDER — APIXABAN 5 MG PO TABS: ORAL_TABLET | ORAL | 0 refills | Status: DC

## 2020-07-05 NOTE — Progress Notes (Signed)
Eliquis prescribed for DVT prophylaxis for long distance travel

## 2020-10-27 LAB — RESULTS CONSOLE HPV: CHL HPV: NEGATIVE

## 2020-10-27 LAB — HM PAP SMEAR

## 2020-11-16 ENCOUNTER — Other Ambulatory Visit: Payer: PRIVATE HEALTH INSURANCE

## 2020-11-16 ENCOUNTER — Encounter: Payer: Self-pay | Admitting: Gastroenterology

## 2020-11-16 ENCOUNTER — Ambulatory Visit (INDEPENDENT_AMBULATORY_CARE_PROVIDER_SITE_OTHER): Payer: PRIVATE HEALTH INSURANCE | Admitting: Gastroenterology

## 2020-11-16 VITALS — BP 130/80 | HR 83 | Ht 68.0 in | Wt 177.8 lb

## 2020-11-16 DIAGNOSIS — K5904 Chronic idiopathic constipation: Secondary | ICD-10-CM | POA: Diagnosis not present

## 2020-11-16 DIAGNOSIS — R1030 Lower abdominal pain, unspecified: Secondary | ICD-10-CM

## 2020-11-16 MED ORDER — GLYCOPYRROLATE 1 MG PO TABS: 1.0000 mg | ORAL_TABLET | Freq: Two times a day (BID) | ORAL | 3 refills | Status: DC

## 2020-11-16 MED ORDER — DOCUSATE SODIUM 100 MG PO CAPS: 100.0000 mg | ORAL_CAPSULE | Freq: Two times a day (BID) | ORAL | 0 refills | Status: DC

## 2020-11-16 NOTE — Patient Instructions (Signed)
It was my pleasure to provide care to you today. Based on our discussion, I am providing you with my recommendations below:  RECOMMENDATION(S):   Please proceed to the basement level for lab work before leaving today. Press "B" on the elevator. The lab is located at the first door on the left as you exit the elevator.  HEALTHCARE LAWS AND MY CHART RESULTS:   Due to recent changes in healthcare laws, you may see results of your imaging and/or laboratory studies on MyChart before I have had a chance to review them.  I understand that in some cases there may be results that are confusing or concerning to you. Please understand that not all results are received at same time and often I may need to interpret multiple results in order to provide you with the best plan of care or course of treatment. Therefore, I ask that you please give me 48 hours to thoroughly review all your results before contacting my office for clarification.   PRESCRIPTION MEDICATION(S):   We have sent the following medication(s) to your pharmacy:  Robinul  NOTE: If your medication(s) requires a PRIOR AUTHORIZATION, we will receive notification from your pharmacy. Once received, the process to submit for approval may take up to 7-10 business days. You will be contacted about any denials we have received from your insurance company as well as alternatives recommended by your provider.  FOLLOW UP:  I would like for you to follow up with me in 6 weeks.   BMI:  If you are age 26 or younger, your body mass index should be between 19-25. Your Body mass index is 27.03 kg/m. If this is out of the aformentioned range listed, please consider follow up with your Primary Care Provider.   MY CHART:  The [place] [facility] providers would like to encourage you to use [facility] to communicate with providers for non-urgent requests or questions.  Due to long hold times on the telephone, sending your provider a message by [facility] may be a faster  and more efficient way to get a response.  Please allow 48 business hours for a response.  Please remember that this is for non-urgent requests.   Thank you for trusting me with your gastrointestinal care!    Claudette Head, MD

## 2020-11-16 NOTE — Progress Notes (Signed)
    History of Present Illness: This is a 25 year old female who relates persistent problems with lower abdominal/pelvic pain, lower abdominal bloating, nausea, early satiety and constipation.  She underwent pelvic laparoscopy and was diagnosed with endometriosis in March.  She was diagnosed with protein S deficiency and is maintained on Eliquis.  Colace at bedtime has helped her constipation.  She tried pantoprazole for a month without a change in symptoms.  Current Medications, Allergies, Past Medical History, Past Surgical History, Family History and Social History were reviewed in [employer] record.   Physical Exam: General: Well developed, well nourished, no acute distress Head: Normocephalic and atraumatic Eyes: Sclerae anicteric, EOMI Ears: Normal auditory acuity Mouth: Not examined, mask on during Covid-19 pandemic Lungs: Clear throughout to auscultation Heart: Regular rate and rhythm; no murmurs, rubs or bruits Abdomen: Soft, mild lower abdominal tenderness and non distended. No masses, hepatosplenomegaly or hernias noted. Normal Bowel sounds Rectal: Not done Musculoskeletal: Symmetrical with no gross deformities  Pulses:  Normal pulses noted Extremities: No clubbing, cyanosis, edema or deformities noted Neurological: Alert oriented x 4, grossly nonfocal Psychological:  Alert and cooperative. Normal mood and affect   Assessment and Recommendations:  Constipation.  Endometriosis. R/O IBS-C. Lower abdominal bloating, lower abdominal/pelvic pain, nausea, early satiety.  Follow-up with Dr. Arelia Sneddon for further management of endometriosis.  Obtain IgA, tTG. Increase Colace to 2 daily. Start Robinul 1 mg po bid. Consider colonoscopy and EGD for further evaluation if symptoms not improved.  REV in 6 weeks.

## 2020-11-17 LAB — TISSUE TRANSGLUTAMINASE, IGA: (tTG) Ab, IgA: <1 U/mL

## 2020-11-17 LAB — IGA: Immunoglobulin A: 184 mg/dL (ref 47–310)

## 2020-11-30 MED ORDER — TRULANCE 3 MG PO TABS: 3.0000 mg | ORAL_TABLET | Freq: Every day | ORAL | 3 refills | Status: DC

## 2021-01-03 ENCOUNTER — Ambulatory Visit (INDEPENDENT_AMBULATORY_CARE_PROVIDER_SITE_OTHER): Payer: 59 | Admitting: Gastroenterology

## 2021-01-03 ENCOUNTER — Encounter: Payer: Self-pay | Admitting: Gastroenterology

## 2021-01-03 VITALS — BP 102/66 | HR 85 | Ht 68.0 in | Wt 178.0 lb

## 2021-01-03 DIAGNOSIS — K581 Irritable bowel syndrome with constipation: Secondary | ICD-10-CM | POA: Diagnosis not present

## 2021-01-03 MED ORDER — LINACLOTIDE 145 MCG PO CAPS: 145.0000 ug | ORAL_CAPSULE | Freq: Every day | ORAL | 11 refills | Status: DC

## 2021-01-03 NOTE — Progress Notes (Signed)
    History of Present Illness: This is a 25 year old female with constipation, abdominal bloating and abdominal pain.  Her symptoms have improved on Trulance however she still has incomplete bowel movements approximately 5 times per week associated with abdominal bloating and left-sided and lower abdominal pain.   Current Medications, Allergies, Past Medical History, Past Surgical History, Family History and Social History were reviewed in [employer] record.   Physical Exam: General: Well developed, well nourished, no acute distress Head: Normocephalic and atraumatic Eyes: Sclerae anicteric, EOMI Ears: Normal auditory acuity Mouth: Not examined, mask on during Covid-19 pandemic Lungs: Clear throughout to auscultation Heart: Regular rate and rhythm; no murmurs, rubs or bruits Abdomen: Soft, left sided tender and non distended. No masses, hepatosplenomegaly or hernias noted. Normal Bowel sounds Rectal: Not done Musculoskeletal: Symmetrical with no gross deformities  Pulses:  Normal pulses noted Extremities: No clubbing, cyanosis, edema or deformities noted Neurological: Alert oriented x 4, grossly nonfocal Psychological:  Alert and cooperative. Normal mood and affect   Assessment and Recommendations:   IBS-C. Trulance not adequately controlling symptoms.  Change to Linzess 145 mcg daily.  She is advised to call if symptoms are not adequately controlled or if she has diarrhea for Linzess dose adjustments prior to REV.  Continue Colace for now.  REV in 6 weeks.

## 2021-01-03 NOTE — Patient Instructions (Signed)
Stop taking Trulance.   We have sent the following medications to your pharmacy for you to pick up at your convenience: Linzess 145 mcg daily. Samples have been given to you to get started. This medication may need a prior authorization.   The [place] [facility] providers would like to encourage you to use [facility] to communicate with providers for non-urgent requests or questions.  Due to long hold times on the telephone, sending your provider a message by [facility] may be a faster and more efficient way to get a response.  Please allow 48 business hours for a response.  Please remember that this is for non-urgent requests.   Normal BMI (Body Mass Index- based on height and weight) is between 19 and 25. Your BMI today is Body mass index is 27.06 kg/m. Marland Kitchen Please consider follow up  regarding your BMI with your Primary Care Provider.  Thank you for choosing me and [facility].  Venita Lick. Pleas Koch., MD., Clementeen Graham

## 2021-02-03 ENCOUNTER — Encounter: Payer: Self-pay | Admitting: Nurse Practitioner

## 2021-02-08 ENCOUNTER — Ambulatory Visit (INDEPENDENT_AMBULATORY_CARE_PROVIDER_SITE_OTHER): Payer: 59 | Admitting: Nurse Practitioner

## 2021-02-08 ENCOUNTER — Ambulatory Visit: Payer: 59 | Admitting: Family Medicine

## 2021-02-08 ENCOUNTER — Encounter: Payer: Self-pay | Admitting: Nurse Practitioner

## 2021-02-08 ENCOUNTER — Other Ambulatory Visit: Payer: Self-pay

## 2021-02-08 VITALS — BP 114/78 | HR 76 | Temp 98.2°F | Ht 68.0 in | Wt 178.6 lb

## 2021-02-08 DIAGNOSIS — L42 Pityriasis rosea: Secondary | ICD-10-CM

## 2021-02-08 MED ORDER — BETAMETHASONE DIPROPIONATE 0.05 % EX CREA: TOPICAL_CREAM | CUTANEOUS | 1 refills | Status: DC

## 2021-02-08 MED ORDER — HYDROXYZINE PAMOATE 25 MG PO CAPS: 25.0000 mg | ORAL_CAPSULE | Freq: Every evening | ORAL | 0 refills | Status: DC | PRN

## 2021-02-08 NOTE — Patient Instructions (Addendum)
Avoid hot baths or shower. Do not take vistaril and drive due to risk of sedation. Use topical steriod (betamethasone) if no improvement in 1weeks. Rx sent. Will sent valtrex if no improvement with vistaril and topical steriod. Rash may take up to 82month to completely resolve or if rash continues to spread.  Pityriasis  Pityriasis alba is a skin condition that causes red or pink scaly patches of skin (pityriasis) that then lose color (alba). This is a temporary condition that commonly affects children between the ages of 34 and 16 years. It cannot spread from one child to another (is not contagious). The skin clears up over time, usually within a few months to 1 year. What are the causes? The cause of this condition is not known. Children with allergies may be at higher risk. What are the signs or symptoms? Symptoms of this condition are a series of skin changes. In the first phase, up to 20 small red or pink skin patches covered with fine scales develop. They may be round or oval and slightly itchy. The patches commonly affect the face and cheeks. Some children also get patches in other areas of the body, usually the arms, shoulders, neck, or trunk. The redness eventually goes away. The patches will still have a scaly surface, but the skin loses color and becomes paler than the usual skin color. Over time, the scaly surface also clears up and leaves only smooth pale or white patches. The smooth patches regain the usual skin color within a month to a year. The condition rarely lasts longer than this. How is this diagnosed? This condition is diagnosed based on: Your child's symptoms and medical history. A physical exam. How is this treated? This condition usually goes away without treatment. However, your child's health care provider may suggest: A thick lotion (emollient cream) to moisturize the skin. A mild steroid cream if your child's skin is itchy during the first phase. A moisturizing  cream may be used during the scaly phase. In rare cases, other treatments may be used if the skin patches cover a large area or do not start to go away. These include: Medicated creams. Treatments with ultraviolet light. Follow these instructions at home: Give your child over-the-counter and prescription medicines only as told by your child's health care provider. Use skin creams or lotions only as told by your child's health care provider. Ask your child's health care provider to recommend a moisturizer and a sunscreen. Dry skin and sun exposure can make this condition worse. Keep all follow-up visits. This is important. Contact a health care provider if: Your child still has signs of this condition after 1 year. The affected areas get worse or do not get better with topical medicine, including creams and ointments. Summary Pityriasis alba is a skin condition that causes red or pink scaly patches of skin (pityriasis) that then lose color (alba). This condition commonly affects children between the ages of 75 and 16 years. This condition cannot spread from one child to another (is not contagious). Your child's health care provider may recommend using a mild steroid cream or a moisturizer to relieve itchiness and dryness. Pityriasis alba almost always clears up without treatment within 1 year. This information is not intended to replace advice given to you by your health care provider. Make sure you discuss any questions you have with your health care provider. Document Revised: 01/04/2020 Document Reviewed: 01/04/2020 Elsevier Patient Education  2022 [employer].

## 2021-02-08 NOTE — Progress Notes (Signed)
Subjective:  Patient ID: Amanda Daugherty, female    DOB: 06-15-1995  Age: 25 y.o. MRN: [id number]  CC: Acute Visit (Pt c/o rash on her arm x 2 weeks that has improved but states she now has a rash all over her stomach and back going up her neck. Pt has used otc creams with no relief. )  Rash This is a new problem. The current episode started 1 to 4 weeks ago. The problem is unchanged. The affected locations include the torso and left arm. The rash is characterized by itchiness, dryness and redness. It is unknown if there was an exposure to a precipitant. Pertinent negatives include no congestion, cough, eye pain, facial edema, fatigue, fever, joint pain, nail changes, rhinorrhea, shortness of breath or sore throat. Past treatments include nothing. There is no history of allergies.  First lesion on Left upper arm.  Reviewed past Medical, Social and Family history today.  Outpatient Medications Prior to Visit  Medication Sig Dispense Refill   apixaban (ELIQUIS) 5 MG TABS tablet Start taking 5mg  po twice daily 24hours before and continue for 48 hours after any long distance travel exceeding 4-6 hours for DVT prophylaxis. 30 tablet 0   clonazePAM (KLONOPIN) 0.5 MG tablet Take 0.5 mg by mouth every 30 (thirty) days. Pt states she takes monthly as needed     docusate sodium (COLACE) 100 MG capsule Take 1 capsule (100 mg total) by mouth 2 (two) times daily. 10 capsule 0   Ginger, Zingiber officinalis, (GINGER PO) Take by mouth daily.     levonorgestrel (KYLEENA) 19.5 MG IUD Kyleena 17.5 mcg/24 hrs (68yrs) 19.5mg  intrauterine device  Take by intrauterine route.     linaclotide (LINZESS) 145 MCG CAPS capsule Take 1 capsule (145 mcg total) by mouth daily before breakfast. 30 capsule 11   METAMUCIL FIBER PO Take by mouth daily.     Multiple Vitamin (MULTIVITAMIN ADULT PO) Take by mouth daily.     OVER THE COUNTER MEDICATION      Probiotic Product (PROBIOTIC DAILY) CAPS Take by mouth daily.      SUMAtriptan (IMITREX) 100 MG tablet Take 100 mg by mouth as needed. Pt states she takes this medication as needed     No facility-administered medications prior to visit.    ROS See HPI  Objective:  BP 114/78 (BP Location: Left Arm, Patient Position: Sitting, Cuff Size: Normal)   Pulse 76   Temp 98.2 F (36.8 C) (Temporal)   Ht 5\' 8"  (1.727 m)   Wt 178 lb 9.6 oz (81 kg)   SpO2 100%   BMI 27.16 kg/m   Physical Exam Vitals reviewed.  Constitutional:      General: She is not in acute distress. Cardiovascular:     Rate and Rhythm: Normal rate.     Pulses: Normal pulses.  Pulmonary:     Effort: Pulmonary effort is normal.  Skin:    Findings: Rash present. No bruising. Rash is papular and scaling. Rash is not macular.       Neurological:     Mental Status: She is alert and oriented to person, place, and time.    Assessment & Plan:  This visit occurred during the SARS-CoV-2 public health emergency.  Safety protocols were in place, including screening questions prior to the visit, additional usage of staff PPE, and extensive cleaning of exam room while observing appropriate contact time as indicated for disinfecting solutions.   Amanda Daugherty was seen today for acute visit.  Diagnoses and  all orders for this visit:  Pityriasis rosea -     hydrOXYzine (VISTARIL) 25 MG capsule; Take 1-2 capsules (25-50 mg total) by mouth at bedtime as needed. -     betamethasone dipropionate 0.05 % cream; Avoid breast region Avoid hot baths or shower. Do not take vistaril and drive due to risk of sedation. Use topical steriod (betamethasone) if no improvement in 1weeks. Rx sent. Will sent valtrex if no improvement with vistaril and topical steriod. Rash may take up to 79month to completely resolve or if rash continues to spread.  Problem List Items Addressed This Visit   None Visit Diagnoses     Pityriasis rosea    -  Primary   Relevant Medications   hydrOXYzine (VISTARIL) 25 MG capsule    betamethasone dipropionate 0.05 % cream       Follow-up: Return if symptoms worsen or fail to improve.  Amanda Penna, NP

## 2021-02-09 ENCOUNTER — Telehealth: Payer: Self-pay | Admitting: Nurse Practitioner

## 2021-02-09 DIAGNOSIS — L42 Pityriasis rosea: Secondary | ICD-10-CM

## 2021-02-09 NOTE — Telephone Encounter (Signed)
[facility] called with questions about the betamethasone dipropionate   [facility]  [phone]

## 2021-02-09 NOTE — Telephone Encounter (Signed)
Pharmacist states they need directions in regards to medication. I informed them to use as directed per the Rx and he wants a frequency. Please advise.

## 2021-02-10 MED ORDER — BETAMETHASONE DIPROPIONATE 0.05 % EX CREA
TOPICAL_CREAM | Freq: Two times a day (BID) | CUTANEOUS | 1 refills | Status: DC
Start: 2021-02-10 — End: 2021-03-15

## 2021-02-10 NOTE — Telephone Encounter (Signed)
Pharmacy called back checking status.

## 2021-02-16 ENCOUNTER — Encounter: Payer: Self-pay | Admitting: Nurse Practitioner

## 2021-02-16 ENCOUNTER — Ambulatory Visit (INDEPENDENT_AMBULATORY_CARE_PROVIDER_SITE_OTHER): Payer: 59 | Admitting: Gastroenterology

## 2021-02-16 ENCOUNTER — Encounter: Payer: Self-pay | Admitting: Gastroenterology

## 2021-02-16 VITALS — BP 94/68 | HR 88 | Ht 68.0 in | Wt 180.2 lb

## 2021-02-16 DIAGNOSIS — R140 Abdominal distension (gaseous): Secondary | ICD-10-CM | POA: Diagnosis not present

## 2021-02-16 DIAGNOSIS — L42 Pityriasis rosea: Secondary | ICD-10-CM

## 2021-02-16 DIAGNOSIS — R1084 Generalized abdominal pain: Secondary | ICD-10-CM

## 2021-02-16 DIAGNOSIS — K581 Irritable bowel syndrome with constipation: Secondary | ICD-10-CM | POA: Diagnosis not present

## 2021-02-16 MED ORDER — NA SULFATE-K SULFATE-MG SULF 17.5-3.13-1.6 GM/177ML PO SOLN: 1.0000 | Freq: Once | ORAL | 0 refills | Status: AC

## 2021-02-16 NOTE — Patient Instructions (Signed)
Increase your Linzess 145 mcg taking 2 tablets by mouth daily to equal 290 mcg. If this medication helps then please call our office so we can send the prescription for Linzess 290 mcg daily.   You have been scheduled for a colonoscopy. Please follow written instructions given to you at your visit today.  Please pick up your prep supplies at the pharmacy within the next 1-3 days. If you use inhalers (even only as needed), please bring them with you on the day of your procedure.  Due to recent changes in healthcare laws, you may see the results of your imaging and laboratory studies on MyChart before your provider has had a chance to review them.  We understand that in some cases there may be results that are confusing or concerning to you. Not all laboratory results come back in the same time frame and the provider may be waiting for multiple results in order to interpret others.  Please give [place] 48 hours in order for your provider to thoroughly review all the results before contacting the office for clarification of your results.   The [place] [facility] providers would like to encourage you to use [facility] to communicate with providers for non-urgent requests or questions.  Due to long hold times on the telephone, sending your provider a message by [facility] may be a faster and more efficient way to get a response.  Please allow 48 business hours for a response.  Please remember that this is for non-urgent requests.   Thank you for choosing me and [facility].  Venita Lick. Pleas Koch., MD., Clementeen Graham

## 2021-02-16 NOTE — Progress Notes (Signed)
    History of Present Illness: This is a 25 year old female returning for follow-up of IBS-C.  Symptoms have improved on her current regimen however incomplete bowel movements and generalized abdominal pain with intermittent bloating persist.  She had 1 day with more severe postprandial abdominal pain and bloating after meals. IBgard was added which has improved her symptoms. She is interested in further evaluation of her symptoms and better symptoms control.   Current Medications, Allergies, Past Medical History, Past Surgical History, Family History and Social History were reviewed in [employer] record.   Physical Exam: General: Well developed, well nourished, no acute distress Head: Normocephalic and atraumatic Eyes: Sclerae anicteric, EOMI Ears: Normal auditory acuity Mouth: Not examined, mask on during Covid-19 pandemic Lungs: Clear throughout to auscultation Heart: Regular rate and rhythm; no murmurs, rubs or bruits Abdomen: Soft, mild generalized tenderness without rebound or guarding and non distended. No masses, hepatosplenomegaly or hernias noted. Normal Bowel sounds Rectal: Not done Musculoskeletal: Symmetrical with no gross deformities  Pulses:  Normal pulses noted Extremities: No clubbing, cyanosis, edema or deformities noted Neurological: Alert oriented x 4, grossly nonfocal Psychological:  Alert and cooperative. Normal mood and affect   Assessment and Recommendations:  Suspected IBS-C.  Improved but persistent constipation, generalized abdominal pain and intermittent abdominal bloating. Increase Linzess to 290 mcg po qd. Continue Colace po bid and IBgard 1-2 po tid as needed. Adequate daily hydration long term. Avoid foods and beverages that worsen symptoms. Schedule colonoscopy for further evaluation. The risks (including bleeding, perforation, infection, missed lesions, medication reactions and possible hospitalization or surgery if complications  occur), benefits, and alternatives to colonoscopy with possible biopsy and possible polypectomy were discussed with the patient and they consent to proceed.   Endometriosis.  Follow-up with Dr. Arelia Sneddon. Protein S deficiency.  She has been advised to take prophylactic Eliquis for long distance traveling. She has not taken Eliquis in 1 month. She is advised to avoid long distance travel and not take Eliquis within 2 days of colonoscopy.

## 2021-02-23 MED ORDER — VALACYCLOVIR HCL 500 MG PO TABS: 500.0000 mg | ORAL_TABLET | Freq: Two times a day (BID) | ORAL | 0 refills | Status: DC

## 2021-03-09 ENCOUNTER — Other Ambulatory Visit: Payer: Self-pay

## 2021-03-09 ENCOUNTER — Encounter: Payer: Self-pay | Admitting: Gastroenterology

## 2021-03-09 MED ORDER — LINACLOTIDE 290 MCG PO CAPS: 290.0000 ug | ORAL_CAPSULE | Freq: Every day | ORAL | 11 refills | Status: DC

## 2021-03-15 ENCOUNTER — Ambulatory Visit (AMBULATORY_SURGERY_CENTER): Payer: 59 | Admitting: Gastroenterology

## 2021-03-15 ENCOUNTER — Encounter: Payer: Self-pay | Admitting: Gastroenterology

## 2021-03-15 VITALS — BP 102/67 | HR 71 | Temp 98.1°F | Resp 11 | Ht 68.0 in | Wt 180.0 lb

## 2021-03-15 DIAGNOSIS — R1084 Generalized abdominal pain: Secondary | ICD-10-CM | POA: Diagnosis not present

## 2021-03-15 DIAGNOSIS — K5900 Constipation, unspecified: Secondary | ICD-10-CM | POA: Diagnosis not present

## 2021-03-15 DIAGNOSIS — K581 Irritable bowel syndrome with constipation: Secondary | ICD-10-CM

## 2021-03-15 MED ORDER — SODIUM CHLORIDE 0.9 % IV SOLN: 500.0000 mL | Freq: Once | INTRAVENOUS | Status: DC

## 2021-03-15 NOTE — Patient Instructions (Signed)
YOU HAD AN ENDOSCOPIC PROCEDURE TODAY AT THE [facility]:   Refer to the procedure report that was given to you for any specific questions about what was found during the examination.  If the procedure report does not answer your questions, please call your gastroenterologist to clarify.  If you requested that your care partner not be given the details of your procedure findings, then the procedure report has been included in a sealed envelope for you to review at your convenience later.  YOU SHOULD EXPECT: Some feelings of bloating in the abdomen. Passage of more gas than usual.  Walking can help get rid of the air that was put into your GI tract during the procedure and reduce the bloating. If you had a lower endoscopy (such as a colonoscopy or flexible sigmoidoscopy) you may notice spotting of blood in your stool or on the toilet paper. If you underwent a bowel prep for your procedure, you may not have a normal bowel movement for a few days.  Please Note:  You might notice some irritation and congestion in your nose or some drainage.  This is from the oxygen used during your procedure.  There is no need for concern and it should clear up in a day or so.  SYMPTOMS TO REPORT IMMEDIATELY:   Following lower endoscopy (colonoscopy or flexible sigmoidoscopy):  Excessive amounts of blood in the stool  Significant tenderness or worsening of abdominal pains  Swelling of the abdomen that is new, acute  Fever of 100F or higher  For urgent or emergent issues, a gastroenterologist can be reached at any hour by calling [phone]. Do not use MyChart messaging for urgent concerns.    DIET:  We do recommend a small meal at first, but then you may proceed to your regular diet.  Drink plenty of fluids but you should avoid alcoholic beverages for 24 hours.  ACTIVITY:  You should plan to take it easy for the rest of today and you should NOT DRIVE or use heavy machinery until tomorrow (because  of the sedation medicines used during the test).    FOLLOW UP: Our staff will call the number listed on your records 48-72 hours following your procedure to check on you and address any questions or concerns that you may have regarding the information given to you following your procedure. If we do not reach you, we will leave a message.  We will attempt to reach you two times.  During this call, we will ask if you have developed any symptoms of COVID 19. If you develop any symptoms (ie: fever, flu-like symptoms, shortness of breath, cough etc.) before then, please call [phone].  If you test positive for Covid 19 in the 2 weeks post procedure, please call and report this information to us.    If any biopsies were taken you will be contacted by phone or by letter within the next 1-3 weeks.  Please call us at [phone] if you have not heard about the biopsies in 3 weeks.    SIGNATURES/CONFIDENTIALITY: You and/or your care partner have signed paperwork which will be entered into your electronic medical record.  These signatures attest to the fact that that the information above on your After Visit Summary has been reviewed and is understood.  Full responsibility of the confidentiality of this discharge information lies with you and/or your care-partner. 

## 2021-03-15 NOTE — Progress Notes (Signed)
See 02/16/2021 H&P, no changes.

## 2021-03-15 NOTE — Op Note (Signed)
[facility] Patient Name: Amanda Daugherty Procedure Date: 03/15/2021 3:20 PM MRN: [id number] Endoscopist: Meryl Dare , MD Age: 25 Referring MD:  Date of Birth: [birth year] Gender: Female Account #: [id number] Procedure:                Colonoscopy Indications:              Generalized abdominal pain, Constipation Medicines:                Monitored Anesthesia Care Procedure:                Pre-Anesthesia Assessment:                           - Prior to the procedure, a History and Physical                            was performed, and patient medications and                            allergies were reviewed. The patient's tolerance of                            previous anesthesia was also reviewed. The risks                            and benefits of the procedure and the sedation                            options and risks were discussed with the patient.                            All questions were answered, and informed consent                            was obtained. Prior Anticoagulants: The patient has                            taken no previous anticoagulant or antiplatelet                            agents. ASA Grade Assessment: II - A patient with                            mild systemic disease. After reviewing the risks                            and benefits, the patient was deemed in                            satisfactory condition to undergo the procedure.                           After obtaining informed consent, the colonoscope  was passed under direct vision. Throughout the                            procedure, the patient's blood pressure, pulse, and                            oxygen saturations were monitored continuously. The                            Olympus PCF-H190DL ([id number]) Colonoscope was                            introduced through the anus and advanced to the the                            terminal ileum,  with identification of the                            appendiceal orifice and IC valve. The terminal                            ileum, ileocecal valve, appendiceal orifice, and                            rectum were photographed. The quality of the bowel                            preparation was good. The colonoscopy was performed                            without difficulty. The patient tolerated the                            procedure well. Scope In: 3:37:22 PM Scope Out: 3:59:28 PM Scope Withdrawal Time: 0 hours 17 minutes 11 seconds  Total Procedure Duration: 0 hours 22 minutes 6 seconds  Findings:                 The perianal and digital rectal examinations were                            normal.                           The terminal ileum appeared normal.                           The entire examined colon appeared normal on direct                            and retroflexion views. Complications:            No immediate complications. Estimated blood loss:                            None. Estimated Blood Loss:  Estimated blood loss: none. Impression:               - The examined portion of the ileum was normal.                           - The entire examined colon is normal on direct and                            retroflexion views.                           - No specimens collected. Recommendation:           - Patient has a contact number available for                            emergencies. The signs and symptoms of potential                            delayed complications were discussed with the                            patient. Return to normal activities tomorrow.                            Written discharge instructions were provided to the                            patient.                           - Resume previous diet.                           - Continue present medications.                           - Return to GI office in 6 months. Meryl Dare,  MD 03/15/2021 4:06:56 PM This report has been signed electronically.

## 2021-03-15 NOTE — Progress Notes (Signed)
Pt's states no medical or surgical changes since previsit or office visit. 

## 2021-03-17 ENCOUNTER — Telehealth: Payer: Self-pay

## 2021-03-17 NOTE — Telephone Encounter (Signed)
Second attempt follow up call to pt, no answer.  

## 2021-03-22 ENCOUNTER — Other Ambulatory Visit: Payer: Self-pay

## 2021-03-23 ENCOUNTER — Encounter: Payer: Self-pay | Admitting: Nurse Practitioner

## 2021-03-23 ENCOUNTER — Ambulatory Visit (INDEPENDENT_AMBULATORY_CARE_PROVIDER_SITE_OTHER): Payer: 59 | Admitting: Nurse Practitioner

## 2021-03-23 VITALS — BP 102/70 | HR 88 | Temp 97.8°F | Ht 67.75 in | Wt 172.4 lb

## 2021-03-23 DIAGNOSIS — Z0001 Encounter for general adult medical examination with abnormal findings: Secondary | ICD-10-CM

## 2021-03-23 DIAGNOSIS — G43009 Migraine without aura, not intractable, without status migrainosus: Secondary | ICD-10-CM

## 2021-03-23 DIAGNOSIS — Z91018 Allergy to other foods: Secondary | ICD-10-CM | POA: Diagnosis not present

## 2021-03-23 DIAGNOSIS — F411 Generalized anxiety disorder: Secondary | ICD-10-CM | POA: Diagnosis not present

## 2021-03-23 DIAGNOSIS — Z23 Encounter for immunization: Secondary | ICD-10-CM

## 2021-03-23 DIAGNOSIS — D6859 Other primary thrombophilia: Secondary | ICD-10-CM | POA: Insufficient documentation

## 2021-03-23 LAB — CBC WITH DIFFERENTIAL/PLATELET
Basophils Absolute: 0 10*3/uL (ref 0.0–0.1)
Basophils Relative: 0.8 % (ref 0.0–3.0)
Eosinophils Absolute: 0 10*3/uL (ref 0.0–0.7)
Eosinophils Relative: 0.9 % (ref 0.0–5.0)
HCT: 40.6 % (ref 36.0–46.0)
Hemoglobin: 13.8 g/dL (ref 12.0–15.0)
Lymphocytes Relative: 33.4 % (ref 12.0–46.0)
Lymphs Abs: 1.8 10*3/uL (ref 0.7–4.0)
MCHC: 33.9 g/dL (ref 30.0–36.0)
MCV: 90.1 fl (ref 78.0–100.0)
Monocytes Absolute: 0.5 10*3/uL (ref 0.1–1.0)
Monocytes Relative: 9.1 % (ref 3.0–12.0)
Neutro Abs: 3 10*3/uL (ref 1.4–7.7)
Neutrophils Relative %: 55.8 % (ref 43.0–77.0)
Platelets: 229 10*3/uL (ref 150.0–400.0)
RBC: 4.5 Mil/uL (ref 3.87–5.11)
RDW: 12.1 % (ref 11.5–15.5)
WBC: 5.4 10*3/uL (ref 4.0–10.5)

## 2021-03-23 LAB — COMPREHENSIVE METABOLIC PANEL
ALT: 15 U/L (ref 0–35)
AST: 16 U/L (ref 0–37)
Albumin: 4.8 g/dL (ref 3.5–5.2)
Alkaline Phosphatase: 37 U/L — ABNORMAL LOW (ref 39–117)
BUN: 9 mg/dL (ref 6–23)
CO2: 27 mEq/L (ref 19–32)
Calcium: 9.8 mg/dL (ref 8.4–10.5)
Chloride: 105 mEq/L (ref 96–112)
Creatinine, Ser: 0.76 mg/dL (ref 0.40–1.20)
GFR: 108.25 mL/min (ref >60.00)
Glucose, Bld: 87 mg/dL (ref 70–99)
Potassium: 3.9 mEq/L (ref 3.5–5.1)
Sodium: 140 mEq/L (ref 135–145)
Total Bilirubin: 0.9 mg/dL (ref 0.2–1.2)
Total Protein: 7.5 g/dL (ref 6.0–8.3)

## 2021-03-23 LAB — TSH: TSH: 1.15 u[IU]/mL (ref 0.35–5.50)

## 2021-03-23 MED ORDER — EPINEPHRINE 0.3 MG/0.3ML IJ SOAJ: 0.3000 mg | INTRAMUSCULAR | 1 refills | Status: AC | PRN

## 2021-03-23 MED ORDER — APIXABAN 5 MG PO TABS
ORAL_TABLET | ORAL | 0 refills | Status: DC
Start: 2021-03-23 — End: 2021-05-18

## 2021-03-23 MED ORDER — DULOXETINE HCL 20 MG PO CPEP: 20.0000 mg | ORAL_CAPSULE | Freq: Every day | ORAL | 5 refills | Status: DC

## 2021-03-23 MED ORDER — SUMATRIPTAN SUCCINATE 100 MG PO TABS
100.0000 mg | ORAL_TABLET | ORAL | 0 refills | Status: DC | PRN
Start: 2021-03-23 — End: 2022-05-10

## 2021-03-23 NOTE — Patient Instructions (Signed)
Start cymbalta as prescribed You will be contacted to schedule appt with therapist.  Go to lab for blood draw  Sign medical release to get records from GYN and previous pediatrician  Managing Anxiety, Adult After being diagnosed with anxiety, you may be relieved to know why you have felt or behaved a certain way. You may also feel overwhelmed about the treatment ahead and what it will mean for your life. With care and support, you can manage this condition. How to manage lifestyle changes Managing stress and anxiety Stress is your body's reaction to life changes and events, both good and bad. Most stress will last just a few hours, but stress can be ongoing and can lead to more than just stress. Although stress can play a major role in anxiety, it is not the same as anxiety. Stress is usually caused by something external, such as a deadline, test, or competition. Stress normally passes after the triggering event has ended.  Anxiety is caused by something internal, such as imagining a terrible outcome or worrying that something will go wrong that will devastate you. Anxiety often does not go away even after the triggering event is over, and it can become long-term (chronic) worry. It is important to understand the differences between stress and anxiety and to manage your stress effectively so that it does not lead to an anxious response. Talk with your health care provider or a counselor to learn more about reducing anxiety and stress. He or she may suggest tension reduction techniques, such as: Music therapy. Spend time creating or listening to music that you enjoy and that inspires you. Mindfulness-based meditation. Practice being aware of your normal breaths while not trying to control your breathing. It can be done while sitting or walking. Centering prayer. This involves focusing on a word, phrase, or sacred image that means something to you and brings you peace. Deep breathing. To do this,  expand your stomach and inhale slowly through your nose. Hold your breath for 3-5 seconds. Then exhale slowly, letting your stomach muscles relax. Self-talk. Learn to notice and identify thought patterns that lead to anxiety reactions and change those patterns to thoughts that feel peaceful. Muscle relaxation. Taking time to tense muscles and then relax them. Choose a tension reduction technique that fits your lifestyle and personality. These techniques take time and practice. Set aside 5-15 minutes a day to do them. Therapists can offer counseling and training in these techniques. The training to help with anxiety may be covered by some insurance plans. Other things you can do to manage stress and anxiety include: Keeping a stress diary. This can help you learn what triggers your reaction and then learn ways to manage your response. Thinking about how you react to certain situations. You may not be able to control everything, but you can control your response. Making time for activities that help you relax and not feeling guilty about spending your time in this way. Doing visual imagery. This involves imagining or creating mental pictures to help you relax. Practicing yoga. Through yoga poses, you can lower tension and promote relaxation.  Medicines Medicines can help ease symptoms. Medicines for anxiety include: Antidepressant medicines. These are usually prescribed for long-term daily control. Anti-anxiety medicines. These may be added in severe cases, especially when panic attacks occur. Medicines will be prescribed by a health care provider. When used together, medicines, psychotherapy, and tension reduction techniques may be the most effective treatment. Relationships Relationships can play a big part in  helping you recover. Try to spend more time connecting with trusted friends and family members. Consider going to couples counseling if you have a partner, taking family education classes, or  going to family therapy. Therapy can help you and others better understand your condition. How to recognize changes in your anxiety Everyone responds differently to treatment for anxiety. Recovery from anxiety happens when symptoms decrease and stop interfering with your daily activities at home or work. This may mean that you will start to: Have better concentration and focus. Worry will interfere less in your daily thinking. Sleep better. Be less irritable. Have more energy. Have improved memory. It is also important to recognize when your condition is getting worse. Contact your health care provider if your symptoms interfere with home or work and you feel like your condition is not improving. Follow these instructions at home: Activity Exercise. Adults should do the following: Exercise for at least 150 minutes each week. The exercise should increase your heart rate and make you sweat (moderate-intensity exercise). Strengthening exercises at least twice a week. Get the right amount and quality of sleep. Most adults need 7-9 hours of sleep each night. Lifestyle  Eat a healthy diet that includes plenty of vegetables, fruits, whole grains, low-fat dairy products, and lean protein. Do not eat a lot of foods that are high in fats, added sugars, or salt (sodium). Make choices that simplify your life. Do not use any products that contain nicotine or tobacco. These products include cigarettes, chewing tobacco, and vaping devices, such as e-cigarettes. If you need help quitting, ask your health care provider. Avoid caffeine, alcohol, and certain over-the-counter cold medicines. These may make you feel worse. Ask your pharmacist which medicines to avoid. General instructions Take over-the-counter and prescription medicines only as told by your health care provider. Keep all follow-up visits. This is important. Where to find support You can get help and support from these sources: Self-help  groups. Online and [employer]. A trusted spiritual leader. Couples counseling. Family education classes. Family therapy. Where to find more information You may find that joining a support group helps you deal with your anxiety. The following sources can help you locate counselors or support groups near you: Mental Health America: www.mentalhealthamerica.net Anxiety and Depression Association of [place] (ADAA): [employer].de [employer] on Mental Illness (NAMI): www.nami.org Contact a health care provider if: You have a hard time staying focused or finishing daily tasks. You spend many hours a day feeling worried about everyday life. You become exhausted by worry. You start to have headaches or frequently feel tense. You develop chronic nausea or diarrhea. Get help right away if: You have a racing heart and shortness of breath. You have thoughts of hurting yourself or others. If you ever feel like you may hurt yourself or others, or have thoughts about taking your own life, get help right away. Go to your nearest emergency department or: Call your local emergency services (911 in the [place] Call a suicide crisis helpline, such as the National Suicide Prevention Lifeline at 832 048 2772 or 988 in the [place] This is open 24 hours a day in the [place] Text the Crisis Text Line at 850-068-6691 (in the [place] Summary Taking steps to learn and use tension reduction techniques can help calm you and help prevent triggering an anxiety reaction. When used together, medicines, psychotherapy, and tension reduction techniques may be the most effective treatment. Family, friends, and partners can play a big part in supporting you. This information is not  intended to replace advice given to you by your health care provider. Make sure you discuss any questions you have with your health care provider. Document Revised: 10/19/2020 Document Reviewed: 07/17/2020 Elsevier Patient Education  2022  [employer].

## 2021-03-23 NOTE — Progress Notes (Signed)
Subjective:    Patient ID: Amanda Daugherty, female    DOB: 05-20-1995, 25 y.o.   MRN: [id number]  Patient presents today for CPE and eval of chronic conditions  Allergic Reaction This is a recurrent problem. The current episode started more than 1 week ago. The problem occurs intermittently. The problem is unchanged. Associated with: pecans and walnuts. Time of exposure: after exposure. Associated symptoms include globus sensation, itching and trouble swallowing. Pertinent negatives include no abdominal pain, chest pain, chest pressure, coughing, diarrhea, difficulty breathing, drooling, eye itching, eye redness, eye watering, hyperventilation, rash, stridor, vomiting or wheezing. Swelling is present on the tongue. Past treatments include diphenhydramine. The treatment provided significant relief. There is no history of atopic dermatitis, food allergies, medication allergies or seasonal allergies.  Anxiety Chronic, worsening due to health concerns( chronic pelvic pain, mild endometriosis, and protein S deficiency) Previous use of klonopin 3-4x/week, prescribed by GYN: Dr. Arelia Sneddon. No previous therapy session. No ETOH use, some caffeine consumption (green tea), no tobacco or illicit drug use.  We discussed pros and cons of SSRI vs SNRI vs TCA(help with migraines as well) She opted to start cymbalta. New rx sent Also agreed to psychology referral F/up in 49month  Protein S deficiency (HCC) Under the care of Dr.kale (hematology) Has appt once a year Use of eliquis with migrain headache, 2x/month and if travelling long distance Refill sent  Migraine Reports per months Resolves with use of imitrex 100mg  x 2 Refill sent  Vision:will schedule Dental:up to date Diet: heart health Exercise:walking and weight trainging Weight:  Wt Readings from Last 3 Encounters:  03/23/21 172 lb 6.4 oz (78.2 kg)  03/15/21 180 lb (81.6 kg)  02/16/21 180 lb 4 oz (81.8 kg)    Sexual History  (orientation,birth control, marital status, STD): pelvic and breast exam completed by GYN: Dr. 13/10/22. No need for STD screen. IUD in place for contraception  Depression/Suicide: Depression screen [facility] 2/9 03/23/2021 03/21/2020  Decreased Interest 1 1  Down, Depressed, Hopeless 1 1  PHQ - 2 Score 2 2  Altered sleeping 2 1  Tired, decreased energy 2 0  Change in appetite 2 1  Feeling bad or failure about yourself  1 1  Trouble concentrating 2 0  Moving slowly or fidgety/restless 1 0  Suicidal thoughts 0 0  PHQ-9 Score 12 5  Difficult doing work/chores Somewhat difficult Very difficult   GAD 7 : Generalized Anxiety Score 03/23/2021 03/21/2020  Nervous, Anxious, on Edge 2 1  Control/stop worrying 2 1  Worry too much - different things 2 1  Trouble relaxing 2 0  Restless 2 0  Easily annoyed or irritable 2 2  Afraid - awful might happen 1 2  Total GAD 7 Score 13 7  Anxiety Difficulty Somewhat difficult Very difficult   Immunizations: (TDAP, Hep C screen, Pneumovax, Influenza, zoster)  Health Maintenance  Topic Date Due   Pneumococcal Vaccination (1 - PCV) Never done   HPV Vaccine (1 - 2-dose series) Never done   Hepatitis C Screening: USPSTF Recommendation to screen - Ages 46-79 yo.  Never done   COVID-19 Vaccine (3 - Moderna risk series) 06/17/2019   Pap Smear  12/25/2020   Pap Smear  12/25/2020   Tetanus Vaccine  03/24/2031   Flu Shot  Completed   HIV Screening  Completed   Fall Risk: Fall Risk  03/23/2021 03/21/2020  Falls in the past year? 0 0  Number falls in past yr: 0 0  Injury  with Fall? 0 0  Risk for fall due to : No Fall Risks -  Follow up Falls evaluation completed -   Medications and allergies reviewed with patient and updated if appropriate.  Patient Active Problem List   Diagnosis Date Noted   Chronic pelvic pain in female 03/24/2021   History of endometriosis 03/24/2021   Protein S deficiency (HCC) 03/23/2021   Endometriosis determined by laparoscopy  06/09/2020   Migraine 04/13/2020   Anxiety 03/21/2020   Nausea 03/21/2020   Chronic idiopathic constipation 03/21/2020   H/O idiopathic thrombocytopenic purpura 09/20/1998    Current Outpatient Medications on File Prior to Visit  Medication Sig Dispense Refill   clonazePAM (KLONOPIN) 0.5 MG tablet Take 0.5 mg by mouth every 30 (thirty) days. Pt states she takes monthly as needed     docusate sodium (COLACE) 100 MG capsule Take 1 capsule (100 mg total) by mouth 2 (two) times daily. 10 capsule 0   Ginger, Zingiber officinalis, (GINGER PO) Take by mouth daily.     levonorgestrel (KYLEENA) 19.5 MG IUD Kyleena 17.5 mcg/24 hrs (47yrs) 19.5mg  intrauterine device  Take by intrauterine route.     linaclotide (LINZESS) 290 MCG CAPS capsule Take 1 capsule (290 mcg total) by mouth daily before breakfast. 30 capsule 11   METAMUCIL FIBER PO Take by mouth daily.     Multiple Vitamin (MULTIVITAMIN ADULT PO) Take by mouth daily.     OVER THE COUNTER MEDICATION      Probiotic Product (PROBIOTIC DAILY) CAPS Take by mouth daily.     No current facility-administered medications on file prior to visit.    Past Medical History:  Diagnosis Date   Chronic idiopathic constipation    GI-- dr stark   Endometriosis    Family history of protein S deficiency    pt's mother   History of ITP    IBS (irritable bowel syndrome)    Migraines    Pelvic pain    Protein S deficiency [facility])    hematology/ oncology--- dr Candise Che   (06-06-2020  per pt has never had clot)   Protein S deficiency (HCC)     Past Surgical History:  Procedure Laterality Date   LAPAROSCOPIC ENDOMETRIOSIS FULGURATION     LAPAROSCOPY N/A 06/09/2020   Procedure: LAPAROSCOPY DIAGNOSTIC, CAUTERY OF ENDOMETRIOSIS;  Surgeon: Richardean Chimera, MD;  Location: [facility] [place];  Service: Gynecology;  Laterality: N/A;   TONSILLECTOMY  2017   Social History   Socioeconomic History   Marital status: Single    Spouse name: Not on file   Number  of children: Not on file   Years of education: Not on file   Highest education level: Not on file  Occupational History   Not on file  Tobacco Use   Smoking status: Never    Passive exposure: Never   Smokeless tobacco: Never  Vaping Use   Vaping Use: Never used  Substance and Sexual Activity   Alcohol use: Not Currently    Comment: social   Drug use: Never   Sexual activity: Not on file  Other Topics Concern   Not on file  Social History Narrative   Not on file   Social Determinants of Health   Financial Resource Strain: Not on file  Food Insecurity: Not on file  Transportation Needs: Not on file  Physical Activity: Not on file  Stress: Not on file  Social Connections: Not on file    Family History  Problem Relation Age of Onset  Healthy Mother    Healthy Father    Hyperlipidemia Father    Colon cancer Maternal Grandmother    Uterine cancer Maternal Grandmother    Diabetes Maternal Grandfather    Heart disease Maternal Grandfather    Heart disease Paternal Grandfather    Melanoma Paternal Grandfather    Liver disease Neg Hx    Pancreatic cancer Neg Hx    Esophageal cancer Neg Hx    Stomach cancer Neg Hx        Review of Systems  Constitutional:  Negative for fever, malaise/fatigue and weight loss.  HENT:  Positive for trouble swallowing. Negative for congestion, drooling and sore throat.   Eyes:  Negative for redness and itching.       Negative for visual changes  Respiratory:  Negative for cough, shortness of breath, wheezing and stridor.   Cardiovascular:  Negative for chest pain, palpitations and leg swelling.  Gastrointestinal:  Negative for abdominal pain, blood in stool, constipation, diarrhea, heartburn and vomiting.  Genitourinary:  Negative for dysuria, frequency and urgency.  Musculoskeletal:  Negative for falls, joint pain and myalgias.  Skin:  Positive for itching. Negative for rash.  Neurological:  Negative for dizziness, sensory change and  headaches.  Endo/Heme/Allergies:  Does not bruise/bleed easily.  Psychiatric/Behavioral:  Negative for depression, substance abuse and suicidal ideas. The patient is not nervous/anxious.    Objective:   Vitals:   03/23/21 0830  BP: 102/70  Pulse: 88  Temp: 97.8 F (36.6 C)  SpO2: 98%   Body mass index is 26.41 kg/m.  Physical Examination:  Physical Exam Constitutional:      General: She is not in acute distress. HENT:     Right Ear: Tympanic membrane, ear canal and external ear normal.     Left Ear: Tympanic membrane, ear canal and external ear normal.  Eyes:     General: No scleral icterus.    Extraocular Movements: Extraocular movements intact.     Conjunctiva/sclera: Conjunctivae normal.  Cardiovascular:     Rate and Rhythm: Normal rate and regular rhythm.     Pulses: Normal pulses.     Heart sounds: Normal heart sounds.  Pulmonary:     Effort: Pulmonary effort is normal. No respiratory distress.     Breath sounds: Normal breath sounds.  Abdominal:     General: Bowel sounds are normal. There is no distension.     Palpations: Abdomen is soft.  Musculoskeletal:        General: Normal range of motion.     Cervical back: Normal range of motion and neck supple.  Lymphadenopathy:     Cervical: No cervical adenopathy.  Skin:    General: Skin is warm and dry.  Neurological:     Mental Status: She is alert and oriented to person, place, and time.  Psychiatric:        Mood and Affect: Mood normal.        Behavior: Behavior normal.        Thought Content: Thought content normal.   ASSESSMENT and PLAN: This visit occurred during the SARS-CoV-2 public health emergency.  Safety protocols were in place, including screening questions prior to the visit, additional usage of staff PPE, and extensive cleaning of exam room while observing appropriate contact time as indicated for disinfecting solutions.   Bana was seen today for annual exam.  Diagnoses and all orders for  this visit:  Encounter for preventative adult health care exam with abnormal findings -  Comprehensive metabolic panel -     CBC with Differential/Platelet -     TSH  Nut allergy -     EPINEPHrine 0.3 mg/0.3 mL IJ SOAJ injection; Inject 0.3 mg into the muscle as needed for anaphylaxis. -     Food Allergy Profile -     Interpretation:  Migraine without aura and without status migrainosus, not intractable -     SUMAtriptan (IMITREX) 100 MG tablet; Take 1 tablet (100 mg total) by mouth as needed. Pt states she takes this medication as needed  GAD (generalized anxiety disorder) -     DULoxetine (CYMBALTA) 20 MG capsule; Take 1 capsule (20 mg total) by mouth daily. -     Ambulatory referral to Psychology  Protein S deficiency (HCC) -     apixaban (ELIQUIS) 5 MG TABS tablet; Start taking  po twice daily 24hours before and continue for 48 hours after any long distance travel exceeding 4-6 hours for DVT prophylaxis.  Flu vaccine need -     Flu Vaccine QUAD 6+ mos PF IM (Fluarix Quad PF)  Need for diphtheria-tetanus-pertussis (Tdap) vaccine -     Tdap vaccine greater than or equal to 7yo IM    Sign medical release to get records from GYN (PAP report)and previous pediatrician (HPV records)  Problem List Items Addressed This Visit       Cardiovascular and Mediastinum   Migraine    Reports per months Resolves with use of imitrex  x 2 Refill sent      Relevant Medications   apixaban (ELIQUIS) 5 MG TABS tablet   EPINEPHrine 0.3 mg/0.3 mL IJ SOAJ injection   SUMAtriptan (IMITREX) 100 MG tablet   DULoxetine (CYMBALTA) 20 MG capsule     Hematopoietic and Hemostatic   Protein S deficiency (HCC)    Under the care of Dr.kale (hematology) Has appt once a year Use of eliquis with migrain headache, 2x/month and if travelling long distance Refill sent      Relevant Medications   apixaban (ELIQUIS) 5 MG TABS tablet     Other   Anxiety    Chronic, worsening due  to health concerns( chronic pelvic pain, mild endometriosis, and protein S deficiency) Previous use of klonopin 3-4x/week, prescribed by GYN: Dr. Arelia Sneddon. No previous therapy session. No ETOH use, some caffeine consumption (green tea), no tobacco or illicit drug use.  We discussed pros and cons of SSRI vs SNRI vs TCA(help with migraines as well) She opted to start cymbalta. New rx sent Also agreed to psychology referral F/up in 68month      Relevant Medications   DULoxetine (CYMBALTA) 20 MG capsule   Other Visit Diagnoses     Encounter for preventative adult health care exam with abnormal findings    -  Primary   Relevant Orders   Comprehensive metabolic panel (Completed)   CBC with Differential/Platelet (Completed)   TSH (Completed)   Nut allergy       Relevant Medications   EPINEPHrine 0.3 mg/0.3 mL IJ SOAJ injection   Other Relevant Orders   Food Allergy Profile (Completed)   Interpretation: (Completed)   Flu vaccine need       Relevant Orders   Flu Vaccine QUAD 6+ mos PF IM (Fluarix Quad PF) (Completed)   Need for diphtheria-tetanus-pertussis (Tdap) vaccine       Relevant Orders   Tdap vaccine greater than or equal to 7yo IM (Completed)       Follow up: Return in about 6  weeks (around 05/04/2021) for anxiety (video).  Alysia Penna, NP

## 2021-03-23 NOTE — Assessment & Plan Note (Addendum)
Chronic, worsening due to health concerns( chronic pelvic pain, mild endometriosis, and protein S deficiency) Previous use of klonopin 3-4x/week, prescribed by GYN: Dr. Arelia Sneddon. No previous therapy session. No ETOH use, some caffeine consumption (green tea), no tobacco or illicit drug use.  We discussed pros and cons of SSRI vs SNRI vs TCA(help with migraines as well) She opted to start cymbalta. New rx sent Also agreed to psychology referral F/up in 64month

## 2021-03-23 NOTE — Assessment & Plan Note (Signed)
>>  ASSESSMENT AND PLAN FOR ANXIETY WRITTEN ON 03/24/2021  2:57 PM BY Kanchan Gal LUM, NP  Chronic, worsening due to health concerns( chronic pelvic pain, mild endometriosis, and protein S deficiency) Previous use of klonopin 3-4x/week, prescribed by GYN: Dr. Arelia Sneddon. No previous therapy session. No ETOH use, some caffeine consumption (green tea), no tobacco or illicit drug use.  We discussed pros and cons of SSRI vs SNRI vs TCA(help with migraines as well) She opted to start cymbalta. New rx sent Also agreed to psychology referral F/up in 2month

## 2021-03-24 ENCOUNTER — Encounter: Payer: Self-pay | Admitting: Nurse Practitioner

## 2021-03-24 DIAGNOSIS — Z8742 Personal history of other diseases of the female genital tract: Secondary | ICD-10-CM | POA: Insufficient documentation

## 2021-03-24 DIAGNOSIS — R102 Pelvic and perineal pain: Secondary | ICD-10-CM | POA: Insufficient documentation

## 2021-03-24 DIAGNOSIS — G8929 Other chronic pain: Secondary | ICD-10-CM | POA: Insufficient documentation

## 2021-03-24 LAB — FOOD ALLERGY PROFILE
Allergen, Salmon, f41: <0.1 kU/L
Almonds: <0.1 kU/L
CLASS: 0
CLASS: 0
CLASS: 0
CLASS: 0
CLASS: 0
CLASS: 0
CLASS: 0
CLASS: 0
CLASS: 0
CLASS: 0
CLASS: 0
Cashew IgE: <0.1 kU/L
Class: 0
Class: 0
Class: 0
Class: 0
Egg White IgE: <0.1 kU/L
Fish Cod: <0.1 kU/L
Hazelnut: <0.1 kU/L
Milk IgE: <0.1 kU/L
Peanut IgE: <0.1 kU/L
Scallop IgE: <0.1 kU/L
Sesame Seed f10: <0.1 kU/L
Shrimp IgE: <0.1 kU/L
Soybean IgE: <0.1 kU/L
Tuna IgE: <0.1 kU/L
Walnut: <0.1 kU/L
Wheat IgE: <0.1 kU/L

## 2021-03-24 LAB — INTERPRETATION:

## 2021-03-24 NOTE — Assessment & Plan Note (Signed)
Under the care of Dr.kale (hematology) Has appt once a year Use of eliquis with migrain headache, 2x/month and if travelling long distance Refill sent

## 2021-03-24 NOTE — Assessment & Plan Note (Signed)
Reports per months Resolves with use of imitrex 100mg  x 2 Refill sent

## 2021-04-07 ENCOUNTER — Encounter: Payer: Self-pay | Admitting: Nurse Practitioner

## 2021-04-14 DIAGNOSIS — D6949 Other primary thrombocytopenia: Secondary | ICD-10-CM | POA: Insufficient documentation

## 2021-04-24 ENCOUNTER — Encounter: Payer: Self-pay | Admitting: Nurse Practitioner

## 2021-04-24 DIAGNOSIS — R87610 Atypical squamous cells of undetermined significance on cytologic smear of cervix (ASC-US): Secondary | ICD-10-CM | POA: Insufficient documentation

## 2021-05-11 ENCOUNTER — Ambulatory Visit: Payer: 59 | Admitting: Nurse Practitioner

## 2021-05-15 ENCOUNTER — Encounter: Payer: Self-pay | Admitting: Hematology

## 2021-05-18 ENCOUNTER — Other Ambulatory Visit: Payer: Self-pay

## 2021-05-18 ENCOUNTER — Inpatient Hospital Stay (HOSPITAL_BASED_OUTPATIENT_CLINIC_OR_DEPARTMENT_OTHER): Payer: Commercial Managed Care - HMO | Admitting: Hematology

## 2021-05-18 ENCOUNTER — Inpatient Hospital Stay: Payer: Commercial Managed Care - HMO | Attending: Hematology

## 2021-05-18 ENCOUNTER — Other Ambulatory Visit: Payer: Self-pay | Admitting: Hematology

## 2021-05-18 VITALS — BP 111/76 | HR 69 | Temp 97.9°F | Resp 18 | Wt 175.0 lb

## 2021-05-18 DIAGNOSIS — Z8349 Family history of other endocrine, nutritional and metabolic diseases: Secondary | ICD-10-CM | POA: Diagnosis not present

## 2021-05-18 DIAGNOSIS — Z793 Long term (current) use of hormonal contraceptives: Secondary | ICD-10-CM | POA: Diagnosis not present

## 2021-05-18 DIAGNOSIS — D6859 Other primary thrombophilia: Secondary | ICD-10-CM

## 2021-05-18 DIAGNOSIS — Z800 Family history of malignant neoplasm of digestive organs: Secondary | ICD-10-CM | POA: Diagnosis not present

## 2021-05-18 DIAGNOSIS — Z8249 Family history of ischemic heart disease and other diseases of the circulatory system: Secondary | ICD-10-CM | POA: Diagnosis not present

## 2021-05-18 DIAGNOSIS — Z8049 Family history of malignant neoplasm of other genital organs: Secondary | ICD-10-CM | POA: Insufficient documentation

## 2021-05-18 DIAGNOSIS — R519 Headache, unspecified: Secondary | ICD-10-CM | POA: Diagnosis not present

## 2021-05-18 DIAGNOSIS — Z808 Family history of malignant neoplasm of other organs or systems: Secondary | ICD-10-CM | POA: Diagnosis not present

## 2021-05-18 DIAGNOSIS — G35 Multiple sclerosis: Secondary | ICD-10-CM | POA: Insufficient documentation

## 2021-05-18 DIAGNOSIS — D693 Immune thrombocytopenic purpura: Secondary | ICD-10-CM | POA: Diagnosis not present

## 2021-05-18 DIAGNOSIS — Z833 Family history of diabetes mellitus: Secondary | ICD-10-CM | POA: Diagnosis not present

## 2021-05-18 DIAGNOSIS — Z79899 Other long term (current) drug therapy: Secondary | ICD-10-CM | POA: Diagnosis not present

## 2021-05-18 LAB — CBC WITH DIFFERENTIAL/PLATELET
Abs Immature Granulocytes: 0.01 10*3/uL (ref 0.00–0.07)
Basophils Absolute: 0 10*3/uL (ref 0.0–0.1)
Basophils Relative: 1 %
Eosinophils Absolute: 0.1 10*3/uL (ref 0.0–0.5)
Eosinophils Relative: 1 %
HCT: 38.8 % (ref 36.0–46.0)
Hemoglobin: 13.2 g/dL (ref 12.0–15.0)
Immature Granulocytes: 0 %
Lymphocytes Relative: 35 %
Lymphs Abs: 1.8 10*3/uL (ref 0.7–4.0)
MCH: 30.8 pg (ref 26.0–34.0)
MCHC: 34 g/dL (ref 30.0–36.0)
MCV: 90.4 fL (ref 80.0–100.0)
Monocytes Absolute: 0.4 10*3/uL (ref 0.1–1.0)
Monocytes Relative: 8 %
Neutro Abs: 2.8 10*3/uL (ref 1.7–7.7)
Neutrophils Relative %: 55 %
Platelets: 226 10*3/uL (ref 150–400)
RBC: 4.29 MIL/uL (ref 3.87–5.11)
RDW: 12.1 % (ref 11.5–15.5)
WBC: 5.1 10*3/uL (ref 4.0–10.5)
nRBC: 0 % (ref 0.0–0.2)

## 2021-05-18 LAB — IRON AND IRON BINDING CAPACITY (CC-WL,HP ONLY)
Iron: 114 ug/dL (ref 28–170)
Saturation Ratios: 40 % — ABNORMAL HIGH (ref 10.4–31.8)
TIBC: 287 ug/dL (ref 250–450)
UIBC: 173 ug/dL (ref 148–442)

## 2021-05-18 LAB — CMP (CANCER CENTER ONLY)
ALT: 14 U/L (ref 0–44)
AST: 16 U/L (ref 15–41)
Albumin: 4.6 g/dL (ref 3.5–5.0)
Alkaline Phosphatase: 40 U/L (ref 38–126)
Anion gap: 7 (ref 5–15)
BUN: 8 mg/dL (ref 6–20)
CO2: 26 mmol/L (ref 22–32)
Calcium: 9.6 mg/dL (ref 8.9–10.3)
Chloride: 109 mmol/L (ref 98–111)
Creatinine: 0.71 mg/dL (ref 0.44–1.00)
GFR, Estimated: >60 mL/min (ref >60)
Glucose, Bld: 105 mg/dL — ABNORMAL HIGH (ref 70–99)
Potassium: 3.7 mmol/L (ref 3.5–5.1)
Sodium: 142 mmol/L (ref 135–145)
Total Bilirubin: 0.6 mg/dL (ref 0.3–1.2)
Total Protein: 7.2 g/dL (ref 6.5–8.1)

## 2021-05-18 LAB — FERRITIN: Ferritin: 75 ng/mL (ref 11–307)

## 2021-05-18 MED ORDER — RIVAROXABAN 10 MG PO TABS: ORAL_TABLET | ORAL | 0 refills | Status: AC

## 2021-05-18 NOTE — Progress Notes (Addendum)
HEMATOLOGY/ONCOLOGY CLINIC NOTE  Date of Service: 05/18/2021  Patient Care Team: Nche, Charlene Brooke, NP as PCP - General (Internal Medicine)  CHIEF COMPLAINTS/PURPOSE OF CONSULTATION:  Follow-up for management of protein S deficiency  HISTORY OF PRESENTING ILLNESS:   Amanda Daugherty is a wonderful 26 y.o. female who has been referred to [place] by Dr. Radene Knee, MD for evaluation and management of protein s deficiency. The pt reports that she is doing well overall.  The pt reports that she was referred by her OBGYN due to her mother's history of Protein S deficiency and coagulation disorder. Her mom was diagnosed with this in her 83s and it was discovered through her pregnancy.   The pt notes that she was personal diagnosed with ITP when she was 26 years old. This was a short term 1-2 year acute disease that is no longer been affecting the pt. The pt denies any history of blood clots and denies pregnancy. The pt has an IUD the last five years but denies any estrogen-containing birth controls.   She notes that she herself has never had personal hx of venous thrombo-embolism.  The pt notes that she received a complete hypercoagulation panel when she was 17 or 18, but is unsure of where nor the levels they instructed her.  The pt had a Tonsillectomy when she was 26 years old, with no complications or any major surgeries. The pt denies any stress factors that increase he risk of clotting.   The pt notes that she frequently travels long distance, as she used to live in [place]. The pt also notes her boyfriend lives in [place] and she frequently takes 8 hour flights every 2 months. The pt denies Aspirin usage and admits to using compression socks during.  The pt also notes that the pt was confirmed to have Endometriosis without doing the surgery. The pt also had an abnormal pap smear. She also notes that she had a Biopsy this morning.   The pt is unaware of any medication allergies. She takes  Colace daily, Imitrex and Klonopin prn. The pt denies chemical use and tobacco use, and only drinks alcohol socially. The pt notes that she currently works remotely as a Therapist, sports.  On review of systems, pt reports chronic headaches, Endometriosis and denies fevers, chills, night sweats, black/bloody stools, leg swelling, abdominal pain and any other symptoms.  INTERVAL HISTORY  [name] is here for 1 year follow-up for protein S deficiency.  She notes no acute new symptoms since her last clinic visit.  No issues with new VTE. She notes that she did not tolerate the Eliquis too well due to some body aches and would prefer to switch to Xarelto at a prophylactic dose which she uses during long distance travel. She is still is traveling back and forth to [place] where her fianc is working. No other acute new symptoms.  10 Point review of Systems was done is negative except as noted above.   MEDICAL HISTORY:  Past Medical History:  Diagnosis Date   Chronic idiopathic constipation    GI-- dr stark   Endometriosis    Family history of protein S deficiency    pt's mother   History of ITP    IBS (irritable bowel syndrome)    Migraines    Pelvic pain    Protein S deficiency [facility])    hematology/ oncology--- dr Irene Limbo   (06-06-2020  per pt has never had clot)   Protein S deficiency (  [place])     SURGICAL HISTORY: Past Surgical History:  Procedure Laterality Date   LAPAROSCOPIC ENDOMETRIOSIS FULGURATION     LAPAROSCOPY N/A 06/09/2020   Procedure: LAPAROSCOPY DIAGNOSTIC, CAUTERY OF ENDOMETRIOSIS;  Surgeon: Arvella Nigh, MD;  Location: [place];  Service: Gynecology;  Laterality: N/A;   TONSILLECTOMY  2017    SOCIAL HISTORY: Social History   Socioeconomic History   Marital status: Single    Spouse name: Not on file   Number of children: Not on file   Years of education: Not on file   Highest education level: Not on file  Occupational History   Not on  file  Tobacco Use   Smoking status: Never    Passive exposure: Never   Smokeless tobacco: Never  Vaping Use   Vaping Use: Never used  Substance and Sexual Activity   Alcohol use: Not Currently    Comment: social   Drug use: Never   Sexual activity: Not on file  Other Topics Concern   Not on file  Social History Narrative   Not on file   Social Determinants of Health   Financial Resource Strain: Not on file  Food Insecurity: Not on file  Transportation Needs: Not on file  Physical Activity: Not on file  Stress: Not on file  Social Connections: Not on file  Intimate Partner Violence: Not on file    FAMILY HISTORY: Family History  Problem Relation Age of Onset   Healthy Mother    Healthy Father    Hyperlipidemia Father    Colon cancer Maternal Grandmother    Uterine cancer Maternal Grandmother    Diabetes Maternal Grandfather    Heart disease Maternal Grandfather    Heart disease Paternal Grandfather    Melanoma Paternal Grandfather    Liver disease Neg Hx    Pancreatic cancer Neg Hx    Esophageal cancer Neg Hx    Stomach cancer Neg Hx     ALLERGIES:  has No Known Allergies.  MEDICATIONS:  Current Outpatient Medications  Medication Sig Dispense Refill   apixaban (ELIQUIS) 5 MG TABS tablet Start taking 5mg  po twice daily 24hours before and continue for 48 hours after any long distance travel exceeding 4-6 hours for DVT prophylaxis. 30 tablet 0   clonazePAM (KLONOPIN) 0.5 MG tablet Take 0.5 mg by mouth every 30 (thirty) days. Pt states she takes monthly as needed     docusate sodium (COLACE) 100 MG capsule Take 1 capsule (100 mg total) by mouth 2 (two) times daily. 10 capsule 0   DULoxetine (CYMBALTA) 20 MG capsule Take 1 capsule (20 mg total) by mouth daily. 30 capsule 5   EPINEPHrine 0.3 mg/0.3 mL IJ SOAJ injection Inject 0.3 mg into the muscle as needed for anaphylaxis. 1 each 1   Ginger, Zingiber officinalis, (GINGER PO) Take by mouth daily.     levonorgestrel  (KYLEENA) 19.5 MG IUD Kyleena 17.5 mcg/24 hrs (28yrs) 19.5mg  intrauterine device  Take by intrauterine route.     linaclotide (LINZESS) 290 MCG CAPS capsule Take 1 capsule (290 mcg total) by mouth daily before breakfast. 30 capsule 11   METAMUCIL FIBER PO Take by mouth daily.     Multiple Vitamin (MULTIVITAMIN ADULT PO) Take by mouth daily.     OVER THE COUNTER MEDICATION      Probiotic Product (PROBIOTIC DAILY) CAPS Take by mouth daily.     SUMAtriptan (IMITREX) 100 MG tablet Take 1 tablet (100 mg total) by mouth as needed. Pt states  she takes this medication as needed 10 tablet 0   No current facility-administered medications for this visit.    REVIEW OF SYSTEMS:   10 Point review of Systems was done is negative except as noted above.  PHYSICAL EXAMINATION: ECOG PERFORMANCE STATUS: 0 - Asymptomatic  Vitals:   05/18/21 1120  BP: 111/76  Pulse: 69  Resp: 18  Temp: 97.9 F (36.6 C)  SpO2: 100%   Filed Weights   05/18/21 1120  Weight: 175 lb (79.4 kg)   .Body mass index is 26.81 kg/m. NAD GENERAL:alert, in no acute distress and comfortable SKIN: no acute rashes, no significant lesions EYES: conjunctiva are pink and non-injected, sclera anicteric OROPHARYNX: MMM, no exudates, no oropharyngeal erythema or ulceration NECK: supple, no JVD LYMPH:  no palpable lymphadenopathy in the cervical, axillary or inguinal regions LUNGS: clear to auscultation b/l with normal respiratory effort HEART: regular rate & rhythm ABDOMEN:  normoactive bowel sounds , non tender, not distended. Extremity: no pedal edema PSYCH: alert & oriented x 3 with fluent speech NEURO: no focal motor/sensory deficits  LABORATORY DATA:  I have reviewed the data as listed  CBC Latest Ref Rng & Units 05/18/2021 03/23/2021 06/09/2020  WBC 4.0 - 10.5 K/uL 5.1 5.4 6.5  Hemoglobin 12.0 - 15.0 g/dL 13.2 13.8 14.3  Hematocrit 36.0 - 46.0 % 38.8 40.6 42.5  Platelets 150 - 400 K/uL 226 229.0 229    CMP Latest Ref  Rng & Units 05/18/2021 03/23/2021 05/05/2020  Glucose 70 - 99 mg/dL 105(H) 87 103(H)  BUN 6 - 20 mg/dL 8 9 11   Creatinine 0.44 - 1.00 mg/dL 0.71 0.76 0.79  Sodium 135 - 145 mmol/L 142 140 139  Potassium 3.5 - 5.1 mmol/L 3.7 3.9 4.0  Chloride 98 - 111 mmol/L 109 105 105  CO2 22 - 32 mmol/L 26 27 27   Calcium 8.9 - 10.3 mg/dL 9.6 9.8 9.3  Total Protein 6.5 - 8.1 g/dL 7.2 7.5 7.6  Total Bilirubin 0.3 - 1.2 mg/dL 0.6 0.9 0.6  Alkaline Phos 38 - 126 U/L 40 37(L) 44  AST 15 - 41 U/L 16 16 14(L)  ALT 0 - 44 U/L 14 15 14      RADIOGRAPHIC STUDIES: I have personally reviewed the radiological images as listed and agreed with the findings in the report. No results found.  ASSESSMENT & PLAN:    1) History of protein S deficiency.  No previous history of VTE Component     Latest Ref Rng & Units 05/05/2020  Protein S, Total     60 - 150 % 48 (L)  Protein S-Functional     63 - 140 % 21 (L)    2) Family hx of Protein S deficiency in her mother No personal hx of VTE Has h/o endometriosis and wanted to be evaluated for hypercoagulable state prior to consideration of treatment options for endometriosis including use of estrogen containing birth control pills.  PLAN: -Patient notes no acute new issues over the last year. -She has been taking precautions to prevent VTE as discussed previously. -Prefers prescription for prophylactic Xarelto in place of Eliquis for use with long distance travel for VTE prophylaxis. -Labs done today show normal CBC and CMP.  FOLLOW UP: RTC with Dr Irene Limbo as needed   No orders of the defined types were placed in this encounter.   All of the patients questions were answered with apparent satisfaction. The patient knows to call the clinic with any problems, questions or concerns.  Sullivan Lone MD [place] AAHIVMS [facility] Hematology/Oncology Physician [facility]

## 2021-05-30 ENCOUNTER — Encounter: Payer: Self-pay | Admitting: Gastroenterology

## 2021-11-03 ENCOUNTER — Encounter: Payer: Self-pay | Admitting: Gastroenterology

## 2022-02-28 ENCOUNTER — Other Ambulatory Visit (HOSPITAL_COMMUNITY): Payer: Self-pay

## 2022-02-28 ENCOUNTER — Telehealth: Payer: Self-pay

## 2022-02-28 NOTE — Telephone Encounter (Signed)
Pharmacy Patient Advocate Encounter   Received notification from [employer] that prior authorization for Linzess 290 MCG CAP is needed.    PA submitted on 02/28/22 Key [id number]  Status is pending

## 2022-02-28 NOTE — Telephone Encounter (Signed)
Pharmacy Patient Advocate Encounter  Prior Authorization for LINZESS 290 MCG CAP has been approved.    PA# [id number] Effective dates: 02/28/22 through 03/01/23

## 2022-05-10 ENCOUNTER — Ambulatory Visit (INDEPENDENT_AMBULATORY_CARE_PROVIDER_SITE_OTHER): Payer: PRIVATE HEALTH INSURANCE | Admitting: Nurse Practitioner

## 2022-05-10 ENCOUNTER — Encounter: Payer: Self-pay | Admitting: Nurse Practitioner

## 2022-05-10 VITALS — BP 102/78 | HR 70 | Temp 98.3°F | Resp 16 | Ht 68.0 in | Wt 168.4 lb

## 2022-05-10 DIAGNOSIS — K5904 Chronic idiopathic constipation: Secondary | ICD-10-CM

## 2022-05-10 DIAGNOSIS — L700 Acne vulgaris: Secondary | ICD-10-CM | POA: Insufficient documentation

## 2022-05-10 DIAGNOSIS — Z808 Family history of malignant neoplasm of other organs or systems: Secondary | ICD-10-CM

## 2022-05-10 DIAGNOSIS — F411 Generalized anxiety disorder: Secondary | ICD-10-CM

## 2022-05-10 DIAGNOSIS — G43009 Migraine without aura, not intractable, without status migrainosus: Secondary | ICD-10-CM

## 2022-05-10 DIAGNOSIS — Z1283 Encounter for screening for malignant neoplasm of skin: Secondary | ICD-10-CM

## 2022-05-10 MED ORDER — DULOXETINE HCL 40 MG PO CPEP: 40.0000 mg | ORAL_CAPSULE | Freq: Every day | ORAL | 1 refills | Status: DC

## 2022-05-10 MED ORDER — LINACLOTIDE 290 MCG PO CAPS: 290.0000 ug | ORAL_CAPSULE | Freq: Every day | ORAL | 3 refills | Status: DC

## 2022-05-10 MED ORDER — CLINDAMYCIN PHOS-BENZOYL PEROX 1-5 % EX GEL: Freq: Two times a day (BID) | CUTANEOUS | 1 refills | Status: DC

## 2022-05-10 MED ORDER — RIZATRIPTAN BENZOATE 10 MG PO TABS: 10.0000 mg | ORAL_TABLET | ORAL | 1 refills | Status: DC | PRN

## 2022-05-10 NOTE — Assessment & Plan Note (Addendum)
Worse in last 69months due to stressor at home. No change in characteristics. Has clusters headaches which last 2-5days. Minimal relief with imitrex.  Switched imitrex to ONEOK. Advised about ways to decrease stress.

## 2022-05-10 NOTE — Patient Instructions (Addendum)
Increase cymbalta to 40mg  Change imitrex to maxalt You will be contacted to schedule appt with psychology.  Headache Treatment to decrease Inflammation  To help reduce HEADACHES: Coenzyme Q10 160mg  ONCE DAILY Riboflavin/Vitamin B2 400mg  ONCE DAILY Magnesium oxide 400mg  ONCE - TWICE DAILY May stop after headaches are resolved.

## 2022-05-10 NOTE — Assessment & Plan Note (Signed)
Onset with menarche at age 27 worse with menstrual cycle Associated with scaring on chin and cheeks  IUD in place at this time, unable to use estrogen due to risk of DVT/PE (protein S deficiency) Previous use of doxycycline x 71yrs, caused worsening nausea. Does not want to use Accutane  We discussed use of benzoyl peroxide vs salicylic acid vs clindamycin/benzoyl peroxide. Advised about risk of excessive dryness and need sunscreen. She opted to use clindamycin/benzoyl peroxide. Rx sent

## 2022-05-10 NOTE — Assessment & Plan Note (Signed)
Worsening in last 79months due to stress at work and delay in wedding. Associated with excessive sleep, lack of motivation and interest. No SI/HI/hallucination No ETOH use, caffeine:cup per day No tobacco use or illicit drug use.  Agreed to psychology referral Increase cymbalta to 40mg  F/up in 31month

## 2022-05-10 NOTE — Progress Notes (Signed)
Established Patient Visit  Patient: Amanda Daugherty   DOB: 1995/10/17   27 y.o. Female  MRN: [id number] Visit Date: 05/10/2022  Subjective:    Chief Complaint  Patient presents with   Follow-up    Pt would like to talk about her hormonal acne, she said it's getting worse and she is having trouble focusing now that she works from home.  She would like her cymbalta, linzess refilled and a different migraine medication. Referral to derm for spot on shoulder   HPI Acne vulgaris Onset with menarche at age 70 worse with menstrual cycle Associated with scaring on chin and cheeks  IUD in place at this time, unable to use estrogen due to risk of DVT/PE (protein S deficiency) Previous use of doxycycline x 62yrs, caused worsening nausea. Does not want to use Accutane  We discussed use of benzoyl peroxide vs salicylic acid vs clindamycin/benzoyl peroxide. Advised about risk of excessive dryness and need sunscreen. She opted to use clindamycin/benzoyl peroxide. Rx sent  GAD (generalized anxiety disorder) Worsening in last 64months due to stress at work and delay in wedding. Associated with excessive sleep, lack of motivation and interest. No SI/HI/hallucination No ETOH use, caffeine:cup per day No tobacco use or illicit drug use.  Agreed to psychology referral Increase cymbalta to 40mg  F/up in 45month    Migraine Worse in last 25months due to stressor at home. No change in characteristics. Has clusters headaches which last 2-5days. Minimal relief with imitrex.  Switched imitrex to ONEOK. Advised about ways to decrease stress.  Reviewed medical, surgical, and social history today  Medications: Outpatient Medications Prior to Visit  Medication Sig   clonazePAM (KLONOPIN) 0.5 MG tablet Take 0.5 mg by mouth every 30 (thirty) days. Pt states she takes monthly as needed   EPINEPHrine 0.3 mg/0.3 mL IJ SOAJ injection Inject 0.3 mg into the muscle as needed for anaphylaxis.    levonorgestrel (KYLEENA) 19.5 MG IUD Kyleena 17.5 mcg/24 hrs (95yrs) 19.5mg  intrauterine device  Take by intrauterine route.   Multiple Vitamin (MULTIVITAMIN ADULT PO) Take by mouth daily.   OVER THE COUNTER MEDICATION    Probiotic Product (PROBIOTIC DAILY) CAPS Take by mouth daily.   rivaroxaban (XARELTO) 10 MG TABS tablet Start taking 10mg  po once daily 24hours before and continue for 48 hours after any long distance travel exceeding 4-6 hours for DVT prophylaxis in context of protein S deficiency.   [DISCONTINUED] DULoxetine (CYMBALTA) 20 MG capsule Take 1 capsule (20 mg total) by mouth daily.   [DISCONTINUED] linaclotide (LINZESS) 290 MCG CAPS capsule Take 1 capsule (290 mcg total) by mouth daily before breakfast.   [DISCONTINUED] SUMAtriptan (IMITREX) 100 MG tablet Take 1 tablet (100 mg total) by mouth as needed. Pt states she takes this medication as needed   [DISCONTINUED] docusate sodium (COLACE) 100 MG capsule Take 1 capsule (100 mg total) by mouth 2 (two) times daily.   [DISCONTINUED] Ginger, Zingiber officinalis, (GINGER PO) Take by mouth daily.   [DISCONTINUED] METAMUCIL FIBER PO Take by mouth daily.   No facility-administered medications prior to visit.   Reviewed past medical and social history.   ROS per HPI above      Objective:  BP 102/78 (BP Location: Left Arm, Patient Position: Sitting, Cuff Size: Normal)   Pulse 70   Temp 98.3 F (36.8 C) (Oral)   Resp 16   Ht 5\' 8"  (1.727 m)   Wt 168  lb 6.4 oz (76.4 kg)   SpO2 98%   BMI 25.61 kg/m      Physical Exam Vitals reviewed.  Cardiovascular:     Rate and Rhythm: Normal rate.     Pulses: Normal pulses.  Pulmonary:     Effort: Pulmonary effort is normal.  Neurological:     Mental Status: She is alert.  Psychiatric:        Attention and Perception: Attention normal.        Mood and Affect: Mood is depressed. Affect is tearful.        Speech: Speech normal.        Behavior: Behavior is cooperative.         Thought Content: Thought content normal.        Cognition and Memory: Cognition and memory normal.        Judgment: Judgment normal.     No results found for any visits on 05/10/22.    Assessment & Plan:    Problem List Items Addressed This Visit       Cardiovascular and Mediastinum   Migraine    Worse in last 36months due to stressor at home. No change in characteristics. Has clusters headaches which last 2-5days. Minimal relief with imitrex.  Switched imitrex to ONEOK. Advised about ways to decrease stress.      Relevant Medications   DULoxetine 40 MG CPEP   rizatriptan (MAXALT) 10 MG tablet     Digestive   Chronic idiopathic constipation - Primary   Relevant Medications   linaclotide (LINZESS) 290 MCG CAPS capsule     Musculoskeletal and Integument   Acne vulgaris    Onset with menarche at age 53 worse with menstrual cycle Associated with scaring on chin and cheeks  IUD in place at this time, unable to use estrogen due to risk of DVT/PE (protein S deficiency) Previous use of doxycycline x 44yrs, caused worsening nausea. Does not want to use Accutane  We discussed use of benzoyl peroxide vs salicylic acid vs clindamycin/benzoyl peroxide. Advised about risk of excessive dryness and need sunscreen. She opted to use clindamycin/benzoyl peroxide. Rx sent      Relevant Medications   clindamycin-benzoyl peroxide (BENZACLIN) gel   Other Relevant Orders   Ambulatory referral to Dermatology     Other   GAD (generalized anxiety disorder)    Worsening in last 57months due to stress at work and delay in wedding. Associated with excessive sleep, lack of motivation and interest. No SI/HI/hallucination No ETOH use, caffeine:cup per day No tobacco use or illicit drug use.  Agreed to psychology referral Increase cymbalta to 40mg  F/up in 9month        Relevant Medications   DULoxetine 40 MG CPEP   Other Relevant Orders   Ambulatory referral to Psychology   Other  Visit Diagnoses     Skin cancer screening       Relevant Orders   Ambulatory referral to Dermatology   Family history of melanoma       Relevant Orders   Ambulatory referral to Dermatology      Return in about 4 weeks (around 06/07/2022) for depression and anxiety, headache and CPE.     Wilfred Lacy, NP

## 2022-05-14 ENCOUNTER — Encounter: Payer: Self-pay | Admitting: Nurse Practitioner

## 2022-05-14 ENCOUNTER — Ambulatory Visit (INDEPENDENT_AMBULATORY_CARE_PROVIDER_SITE_OTHER): Payer: PRIVATE HEALTH INSURANCE | Admitting: Nurse Practitioner

## 2022-05-14 VITALS — BP 114/80 | HR 75 | Temp 97.2°F | Ht 68.0 in | Wt 171.0 lb

## 2022-05-14 DIAGNOSIS — Z0001 Encounter for general adult medical examination with abnormal findings: Secondary | ICD-10-CM

## 2022-05-14 DIAGNOSIS — D6859 Other primary thrombophilia: Secondary | ICD-10-CM | POA: Diagnosis not present

## 2022-05-14 DIAGNOSIS — D6949 Other primary thrombocytopenia: Secondary | ICD-10-CM

## 2022-05-14 DIAGNOSIS — F411 Generalized anxiety disorder: Secondary | ICD-10-CM | POA: Diagnosis not present

## 2022-05-14 DIAGNOSIS — D693 Immune thrombocytopenic purpura: Secondary | ICD-10-CM | POA: Diagnosis not present

## 2022-05-14 MED ORDER — DULOXETINE HCL 20 MG PO CPEP: 40.0000 mg | ORAL_CAPSULE | Freq: Every day | ORAL | 3 refills | Status: DC

## 2022-05-14 NOTE — Patient Instructions (Addendum)
Hold multivitamin while take b-complex (1tab daily) magnesium glycicinate (400mg ), Co Q10 (160mg ), calcium (600mg ) and vitamin D (100-2000IU).  Go to lab  Call office for sports medicine referral if no improvement of ankle pain on 4-6weeks.  Ankle Exercises Ask your health care provider which exercises are safe for you. Do exercises exactly as told by your health care provider and adjust them as directed. It is normal to feel mild stretching, pulling, tightness, or discomfort as you do these exercises. Stop right away if you feel sudden pain or your pain gets worse. Do not begin these exercises until told by your health care provider. Stretching and range-of-motion exercises These exercises warm up your muscles and joints. They can help improve the movement and flexibility of your ankle. They may also help to relieve pain. Dorsiflexion/plantar flexion  Sit with your left / right knee straight or bent. Do not rest your foot on anything. Flex your left / right ankle to tilt the top of your foot toward your shin. This is called dorsiflexion. Hold this position for __________ seconds. Point your toes downward to tilt the top of your foot away from your shin. This is called plantar flexion. Hold this position for __________ seconds. Repeat __________ times. Complete this exercise __________ times a day. Ankle alphabet  Sit with your left / right foot supported at your lower leg. Do not rest your foot on anything. Make sure your foot has room to move freely. Think of your left / right foot as a paintbrush: Move your foot to trace each letter of the alphabet in the air. Keep your hip and knee still while you trace the letters. Make the letters as large as you can without causing or increasing any discomfort. Repeat __________ times. Complete this exercise __________ times a day. Passive ankle dorsiflexion This is an exercise in which something or someone moves your ankle for you. Sit in a chair  on a non-carpeted surface. Place your left / right foot on the floor, directly under your left / right knee. Extend your left / right leg for support. Keeping your heel down, slide your left / right foot back toward the chair until you feel a stretch at your ankle or calf. If you do not feel a stretch, slide your buttocks forward to the edge of the chair while keeping your heel down. Hold this stretch for __________ seconds. Repeat __________ times. Complete this exercise __________ times a day. Strengthening exercises These exercises build strength and endurance in your ankle. Endurance is the ability to use your muscles for a long time, even after they get tired. Dorsiflexors These are muscles that lift your foot up. Secure a rubber exercise band or tube to an object, such as a table leg, that will stay still when the band is pulled. Secure the other end around your left / right foot. Sit on the floor. Face the object with your left / right leg extended. The band or tube should be slightly tense when your foot is relaxed. Slowly flex your left / right ankle and toes to bring your foot toward your shin. Hold this position for __________ seconds. Slowly return your foot to the starting position, controlling the band as you do. Repeat __________ times. Complete this exercise __________ times a day. Plantar flexors These are muscles that push your foot down. Sit on the floor with your left / right leg extended. Loop a rubber exercise band or tube around the ball of your left /  right foot. The ball of your foot is on the walking surface, right under your toes. The band or tube should be slightly tense when your foot is relaxed. Slowly point your toes downward, pushing them away from you. Hold this position for __________ seconds. Slowly release the tension in the band or tube, controlling smoothly until your foot is back in the starting position. Repeat __________ times. Complete this exercise  __________ times a day. Towel curls  Sit in a chair on a non-carpeted surface. Put your feet on the floor. Place a towel in front of your feet. If told by your health care provider, add a __________ lb / kg weight to the end of the towel. Keeping your heel on the floor, put your left / right foot on the towel. Pull the towel toward you by grabbing the towel with your toes and curling them under. Keep your heel on the floor. Let your toes relax. Grab the towel again. Keep pulling the towel until it is completely underneath your foot. Repeat __________ times. Complete this exercise __________ times a day. Standing plantar flexion This is an exercise in which you use your toes to lift your body's weight while standing. Stand with your feet shoulder-width apart. Keep your weight spread evenly over the width of your feet while you rise up on your toes. Use a wall or table to steady yourself if needed, but try not to use it for support. If this exercise is too easy, try these options: Shift your weight toward your left / right leg until you feel challenged. If told by your health care provider, lift your uninjured leg off the floor. Hold this position for __________ seconds. Repeat __________ times. Complete this exercise __________ times a day. Tandem walking  Stand with one foot directly in front of the other. Slowly raise your back foot up, lifting your heel before your toes, and place it directly in front of your other foot. Continue to walk in this heel-to-toe way for __________ or for as long as told by your health care provider. Have a countertop or wall nearby to use if needed to keep your balance, but try not to hold onto anything for support. Repeat __________ times. Complete this exercise __________ times a day. This information is not intended to replace advice given to you by your health care provider. Make sure you discuss any questions you have with your health care  provider. Document Revised: 07/04/2021 Document Reviewed: 07/04/2021 Elsevier Patient Education  [employer].

## 2022-05-14 NOTE — Assessment & Plan Note (Signed)
She requested for cymbalta to be switched to 20mg  2tabs daily due to cost of 40mg  tabs New rx sent

## 2022-05-14 NOTE — Progress Notes (Signed)
Complete physical exam  Patient: Amanda Daugherty   DOB: 1995/10/26   27 y.o. Female  MRN: [id number] Visit Date: 05/14/2022  Subjective:    Chief Complaint  Patient presents with   Annual Exam    Fasting labs, no concerns   Amanda Daugherty is a 27 y.o. female who presents today for a complete physical exam. She reports consuming a general diet.  Pilates and some  cardio 4x/week.  She generally feels fairly well. She reports sleeping fairly well. She does not have additional problems to discuss today.  Vision:No Dental:Yes STD Screen:No  BP Readings from Last 3 Encounters:  05/14/22 114/80  05/10/22 102/78  05/18/21 111/76   Wt Readings from Last 3 Encounters:  05/14/22 171 lb (77.6 kg)  05/10/22 168 lb 6.4 oz (76.4 kg)  05/18/21 175 lb (79.4 kg)   Most recent fall risk assessment:    05/10/2022    9:58 AM  Fall Risk   Falls in the past year? 0  Number falls in past yr: 0  Injury with Fall? 0   Depression screen:Yes - Depression  Most recent depression screenings:    05/10/2022   10:46 AM 03/23/2021    9:24 AM  PHQ 2/9 Scores  PHQ - 2 Score 4 2  PHQ- 9 Score 15 12   HPI  Protein S deficiency (HCC) Current use of xarelto No GI/GU bleed or menorrhagia. IUD in place Repeat cbc today  Thrombocytopenic purpura ([place]) Stable CBC for last 2years Repeat cbc today  GAD (generalized anxiety disorder) She requested for cymbalta to be switched to 20mg  2tabs daily due to cost of 40mg  tabs New rx sent  Past Medical History:  Diagnosis Date   Chronic idiopathic constipation    GI-- dr stark   Endometriosis    Family history of protein S deficiency    pt's mother   History of ITP    IBS (irritable bowel syndrome)    Migraines    Pelvic pain    Protein S deficiency [facility])    hematology/ oncology--- dr Irene Limbo   (06-06-2020  per pt has never had clot)   Protein S deficiency ([place])    Past Surgical History:  Procedure Laterality Date   LAPAROSCOPIC ENDOMETRIOSIS  FULGURATION     LAPAROSCOPY N/A 06/09/2020   Procedure: LAPAROSCOPY DIAGNOSTIC, CAUTERY OF ENDOMETRIOSIS;  Surgeon: Arvella Nigh, MD;  Location: [facility];  Service: Gynecology;  Laterality: N/A;   TONSILLECTOMY  2017   Social History   Socioeconomic History   Marital status: Single    Spouse name: Not on file   Number of children: Not on file   Years of education: Not on file   Highest education level: Not on file  Occupational History   Not on file  Tobacco Use   Smoking status: Never    Passive exposure: Never   Smokeless tobacco: Never  Vaping Use   Vaping Use: Never used  Substance and Sexual Activity   Alcohol use: Not Currently    Comment: social   Drug use: Never   Sexual activity: Not on file  Other Topics Concern   Not on file  Social History Narrative   Not on file   Social Determinants of Health   Financial Resource Strain: Not on file  Food Insecurity: Not on file  Transportation Needs: Not on file  Physical Activity: Not on file  Stress: Not on file  Social Connections: Not on file  Intimate Partner Violence:  Not on file   Family Status  Relation Name Status   Mother  Alive   Father  Alive   MGM  (Not Specified)   MGF  (Not Specified)   PGF  (Not Specified)   Neg Hx  (Not Specified)   Family History  Problem Relation Age of Onset   Healthy Mother    Healthy Father    Hyperlipidemia Father    Colon cancer Maternal Grandmother    Uterine cancer Maternal Grandmother    Diabetes Maternal Grandfather    Heart disease Maternal Grandfather    Heart disease Paternal Grandfather    Melanoma Paternal Grandfather    Liver disease Neg Hx    Pancreatic cancer Neg Hx    Esophageal cancer Neg Hx    Stomach cancer Neg Hx    No Known Allergies  Patient Care Team: Tariya Morrissette, Charlene Brooke, NP as PCP - General (Internal Medicine)   Medications: Outpatient Medications Prior to Visit  Medication Sig   clindamycin-benzoyl peroxide  (BENZACLIN) gel Apply topically 2 (two) times daily.   clonazePAM (KLONOPIN) 0.5 MG tablet Take 0.5 mg by mouth every 30 (thirty) days. Pt states she takes monthly as needed   EPINEPHrine 0.3 mg/0.3 mL IJ SOAJ injection Inject 0.3 mg into the muscle as needed for anaphylaxis.   levonorgestrel (KYLEENA) 19.5 MG IUD Kyleena 17.5 mcg/24 hrs (18yrs) 19.5mg  intrauterine device  Take by intrauterine route.   linaclotide (LINZESS) 290 MCG CAPS capsule Take 1 capsule (290 mcg total) by mouth daily before breakfast.   Multiple Vitamin (MULTIVITAMIN ADULT PO) Take by mouth daily.   OVER THE COUNTER MEDICATION    Probiotic Product (PROBIOTIC DAILY) CAPS Take by mouth daily.   rivaroxaban (XARELTO) 10 MG TABS tablet Start taking 10mg  po once daily 24hours before and continue for 48 hours after any long distance travel exceeding 4-6 hours for DVT prophylaxis in context of protein S deficiency.   rizatriptan (MAXALT) 10 MG tablet Take 1 tablet (10 mg total) by mouth as needed for migraine. May repeat in 2 hours if needed   [DISCONTINUED] DULoxetine 40 MG CPEP Take 1 capsule (40 mg total) by mouth daily.   No facility-administered medications prior to visit.    Review of Systems  Constitutional:  Negative for fever.  HENT:  Negative for congestion and sore throat.   Eyes:        Negative for visual changes  Respiratory:  Negative for cough and shortness of breath.   Cardiovascular:  Negative for chest pain, palpitations and leg swelling.  Gastrointestinal:  Negative for blood in stool, constipation and diarrhea.  Genitourinary:  Negative for dysuria, frequency and urgency.  Musculoskeletal:  Positive for arthralgias. Negative for myalgias.  Skin:  Negative for rash.  Neurological:  Negative for dizziness and headaches.  Hematological:  Does not bruise/bleed easily.  Psychiatric/Behavioral:  Negative for suicidal ideas. The patient is not nervous/anxious.        Objective:  BP 114/80 (BP Location:  Right Arm)   Pulse 75   Temp (!) 97.2 F (36.2 C) (Temporal)   Ht 5\' 8"  (1.727 m)   Wt 171 lb (77.6 kg)   SpO2 99%   BMI 26.00 kg/m     Physical Exam Vitals and nursing note reviewed.  Constitutional:      General: She is not in acute distress.    Appearance: She is well-developed.  HENT:     Right Ear: Tympanic membrane, ear canal and external ear normal.  Left Ear: Tympanic membrane, ear canal and external ear normal.     Nose: Nose normal.     Mouth/Throat:     Pharynx: No oropharyngeal exudate.  Eyes:     Extraocular Movements: Extraocular movements intact.     Conjunctiva/sclera: Conjunctivae normal.     Pupils: Pupils are equal, round, and reactive to light.  Cardiovascular:     Rate and Rhythm: Normal rate and regular rhythm.     Pulses: Normal pulses.     Heart sounds: Normal heart sounds.  Pulmonary:     Effort: Pulmonary effort is normal. No respiratory distress.     Breath sounds: Normal breath sounds.  Chest:     Chest wall: No tenderness.  Abdominal:     General: Bowel sounds are normal.     Palpations: Abdomen is soft.  Genitourinary:    Comments: Deferred breast and pelvic exam to GYN Musculoskeletal:        General: Tenderness present. Normal range of motion.     Cervical back: Normal range of motion and neck supple.     Right lower leg: No edema.     Left lower leg: No edema.     Right ankle: Normal.     Left ankle: Tenderness present over the posterior TF ligament. No lateral malleolus, medial malleolus, ATF ligament, AITF ligament, CF ligament, base of 5th metatarsal or proximal fibula tenderness. Anterior drawer test negative. Normal pulse.     Left Achilles Tendon: Normal.     Right foot: Normal.     Left foot: Normal.  Lymphadenopathy:     Cervical: No cervical adenopathy.  Skin:    General: Skin is warm and dry.  Neurological:     Mental Status: She is alert and oriented to person, place, and time.     Deep Tendon Reflexes: Reflexes  are normal and symmetric.  Psychiatric:        Mood and Affect: Mood normal.        Behavior: Behavior normal.        Thought Content: Thought content normal.     No results found for any visits on 05/14/22.    Assessment & Plan:    Routine Health Maintenance and Physical Exam  Immunization History  Administered Date(s) Administered   Influenza,inj,Quad PF,6+ Mos 01/14/2019, 03/21/2020, 03/23/2021   Moderna Sars-Covid-2 Vaccination 04/21/2019, 05/20/2019   Tdap 03/23/2021   Unspecified SARS-COV-2 Vaccination 04/21/2019, 05/20/2019   Health Maintenance  Topic Date Due   COVID-19 Vaccine (5 - 2023-24 season) 05/26/2022 (Originally 12/08/2021)   INFLUENZA VACCINE  07/08/2022 (Originally 11/07/2021)   Hepatitis C Screening  05/11/2023 (Originally 12/10/2012)   PAP-Cervical Cytology Screening  10/28/2023   PAP SMEAR-Modifier  10/28/2023   DTaP/Tdap/Td (2 - Td or Tdap) 03/24/2031   HIV Screening  Completed   HPV VACCINES  Aged Out   Discussed health benefits of physical activity, and encouraged her to engage in regular exercise appropriate for her age and condition.  Problem List Items Addressed This Visit       Musculoskeletal and Integument   Thrombocytopenic purpura (HCC)    Stable CBC for last 2years Repeat cbc today        Hematopoietic and Hemostatic   Protein S deficiency (HCC)    Current use of xarelto No GI/GU bleed or menorrhagia. IUD in place Repeat cbc today      Relevant Orders   CBC with Differential/Platelet     Other   GAD (generalized anxiety disorder)  She requested for cymbalta to be switched to 20mg  2tabs daily due to cost of 40mg  tabs New rx sent      Relevant Medications   DULoxetine (CYMBALTA) 20 MG capsule   Other Visit Diagnoses     Encounter for preventative adult health care exam with abnormal findings    -  Primary   Relevant Orders   Comprehensive metabolic panel   TSH   Immune thrombocytopenic purpura ([place])   (Chronic)      Relevant Orders   CBC with Differential/Platelet      Return for maintain upcoming appt.     Wilfred Lacy, NP

## 2022-05-14 NOTE — Assessment & Plan Note (Signed)
Stable CBC for last 2years Repeat cbc today

## 2022-05-14 NOTE — Assessment & Plan Note (Signed)
Current use of xarelto No GI/GU bleed or menorrhagia. IUD in place Repeat cbc today

## 2022-05-15 LAB — CBC WITH DIFFERENTIAL/PLATELET
Basophils Absolute: 0.1 10*3/uL (ref 0.0–0.1)
Basophils Relative: 0.8 % (ref 0.0–3.0)
Eosinophils Absolute: 0 10*3/uL (ref 0.0–0.7)
Eosinophils Relative: 0.5 % (ref 0.0–5.0)
HCT: 40.6 % (ref 36.0–46.0)
Hemoglobin: 13.9 g/dL (ref 12.0–15.0)
Lymphocytes Relative: 32 % (ref 12.0–46.0)
Lymphs Abs: 2.4 10*3/uL (ref 0.7–4.0)
MCHC: 34.2 g/dL (ref 30.0–36.0)
MCV: 90.8 fl (ref 78.0–100.0)
Monocytes Absolute: 0.6 10*3/uL (ref 0.1–1.0)
Monocytes Relative: 8.6 % (ref 3.0–12.0)
Neutro Abs: 4.4 10*3/uL (ref 1.4–7.7)
Neutrophils Relative %: 58.1 % (ref 43.0–77.0)
Platelets: 252 10*3/uL (ref 150.0–400.0)
RBC: 4.47 Mil/uL (ref 3.87–5.11)
RDW: 12.8 % (ref 11.5–15.5)
WBC: 7.6 10*3/uL (ref 4.0–10.5)

## 2022-05-15 LAB — COMPREHENSIVE METABOLIC PANEL
ALT: 14 U/L (ref 0–35)
AST: 15 U/L (ref 0–37)
Albumin: 4.9 g/dL (ref 3.5–5.2)
Alkaline Phosphatase: 36 U/L — ABNORMAL LOW (ref 39–117)
BUN: 11 mg/dL (ref 6–23)
CO2: 21 mEq/L (ref 19–32)
Calcium: 9.5 mg/dL (ref 8.4–10.5)
Chloride: 104 mEq/L (ref 96–112)
Creatinine, Ser: 0.68 mg/dL (ref 0.40–1.20)
GFR: 119.35 mL/min (ref >60.00)
Glucose, Bld: 72 mg/dL (ref 70–99)
Potassium: 3.7 mEq/L (ref 3.5–5.1)
Sodium: 139 mEq/L (ref 135–145)
Total Bilirubin: 0.8 mg/dL (ref 0.2–1.2)
Total Protein: 7.6 g/dL (ref 6.0–8.3)

## 2022-05-15 LAB — TSH: TSH: 0.68 u[IU]/mL (ref 0.35–5.50)

## 2022-06-07 ENCOUNTER — Telehealth: Payer: PRIVATE HEALTH INSURANCE | Admitting: Nurse Practitioner

## 2022-06-18 IMAGING — US US ABDOMEN LIMITED
1 series · 14 of 25 positions shown · non-contrast
Comparison: None.

CLINICAL DATA: Chronic postprandial abdominal pain, nausea.

EXAM:
ULTRASOUND ABDOMEN LIMITED RIGHT UPPER QUADRANT

[Series 1: us abdomen limited · 0.18mm/px · 14 of 46 slices shown]
[im 1/46]
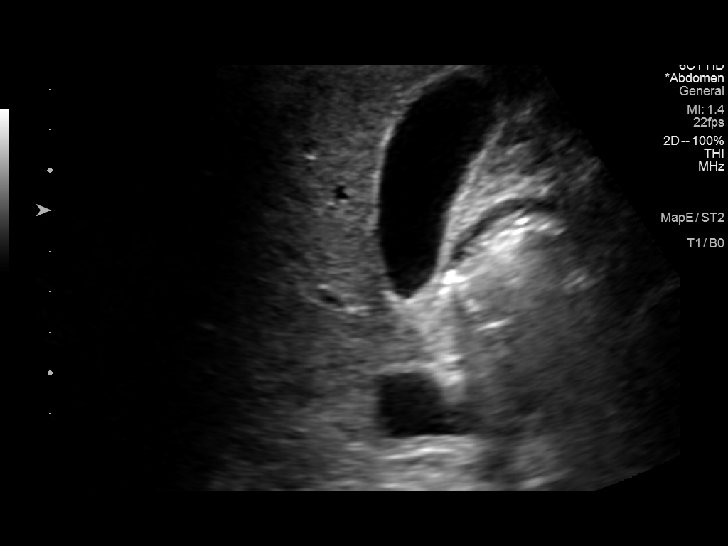
[im 4/46]
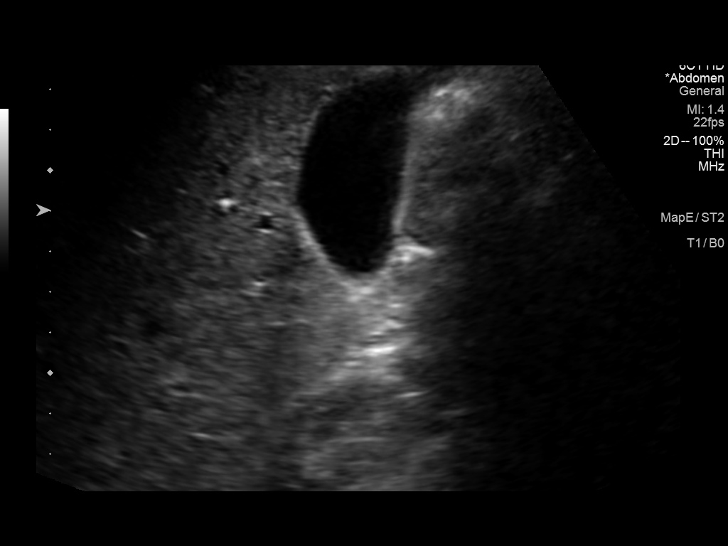
[im 8/46]
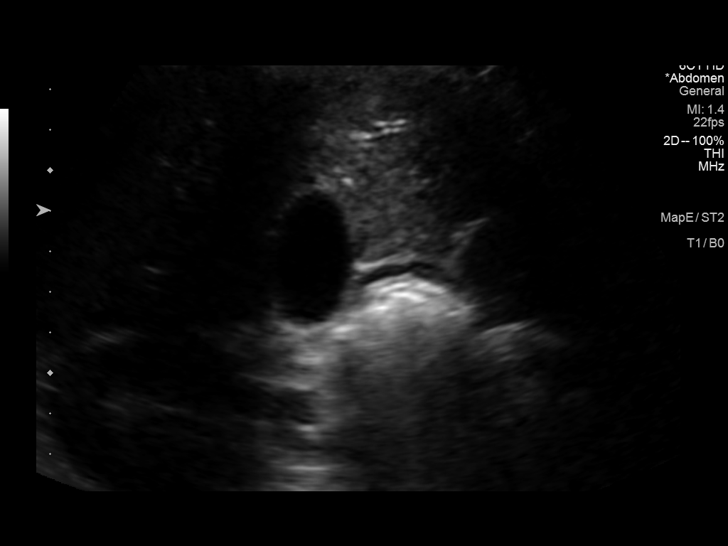
[im 12/46]
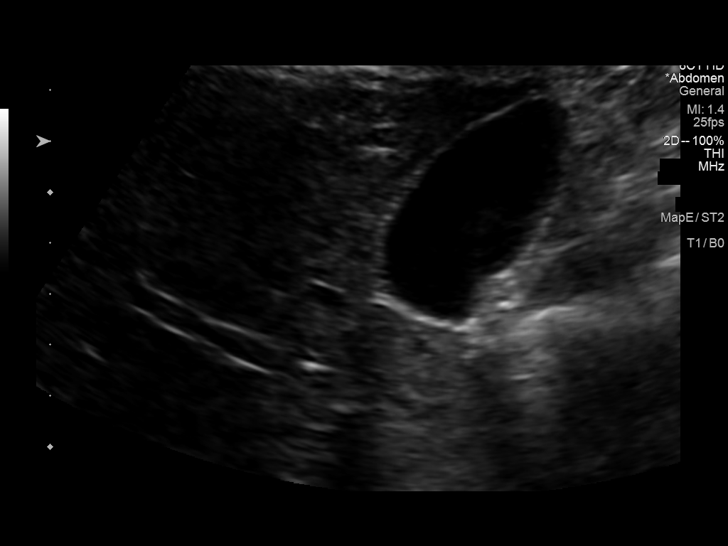
[im 16/46]
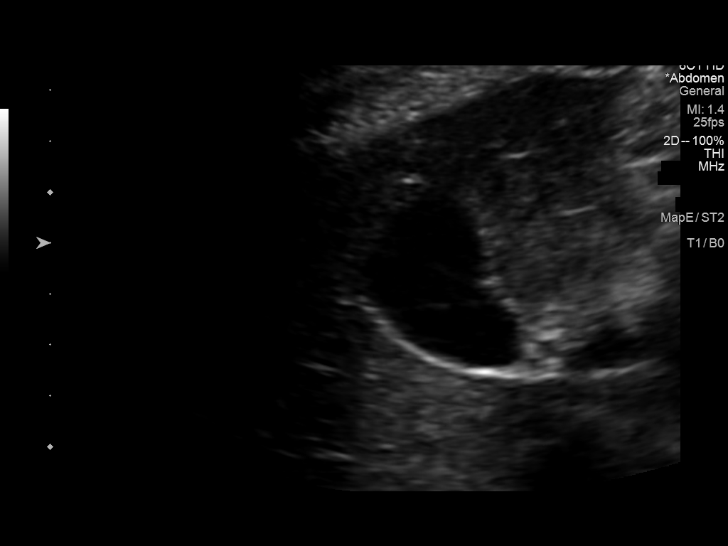
[im 17/46]
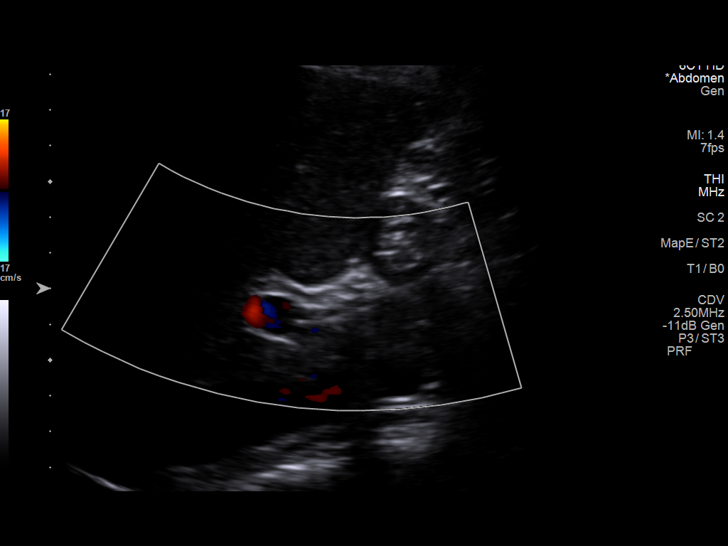
[im 21/46]
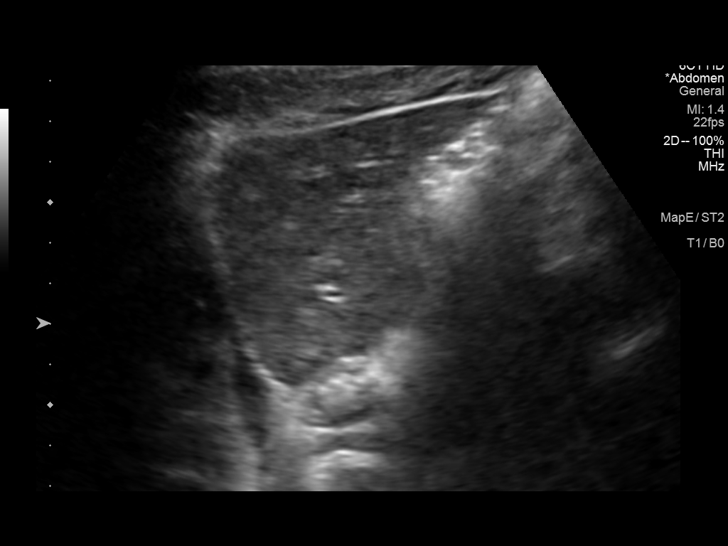
[im 25/46]
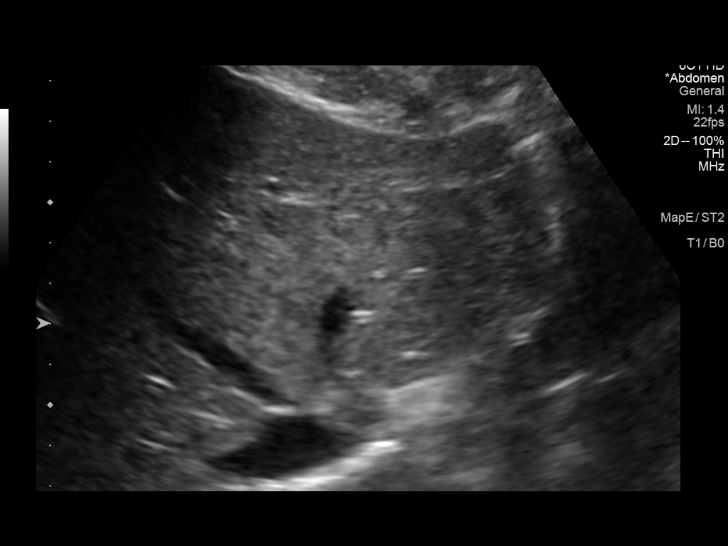
[im 29/46]
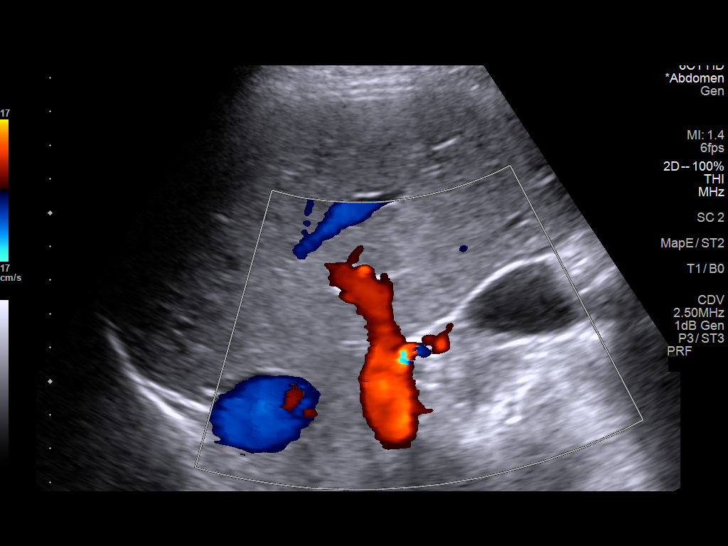
[im 31/46]
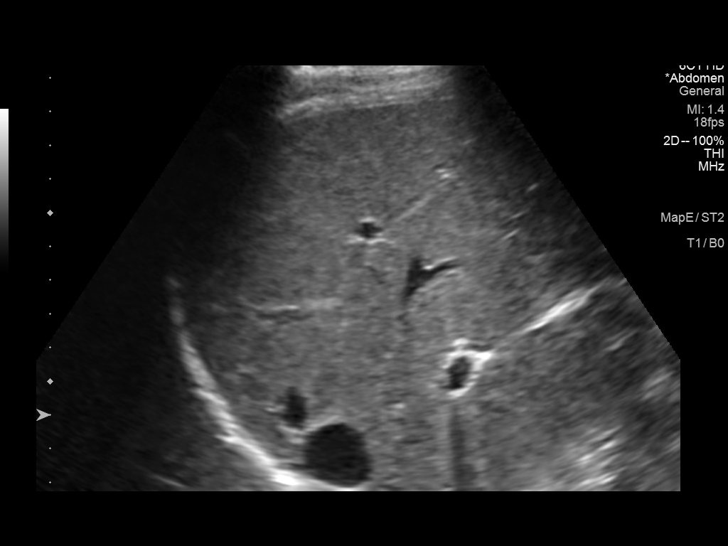
[im 34/46]
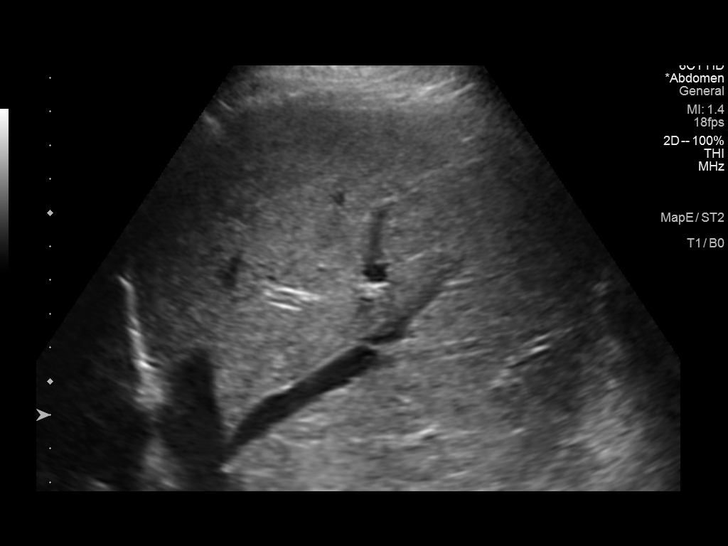
[im 38/46]
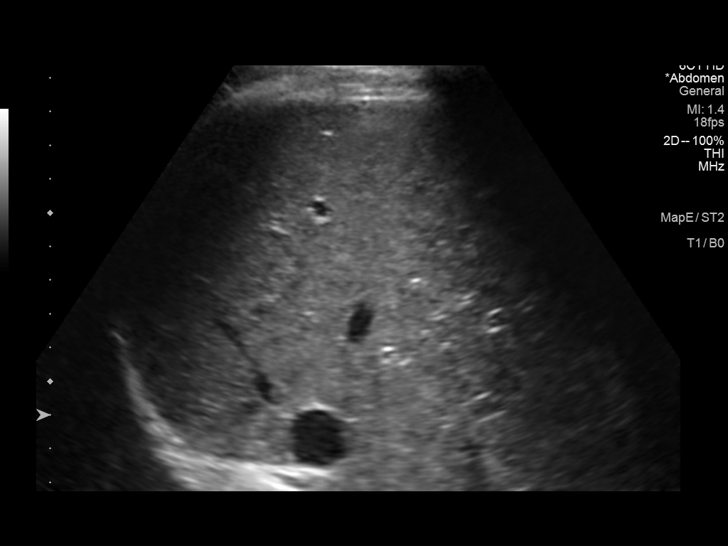
[im 42/46]
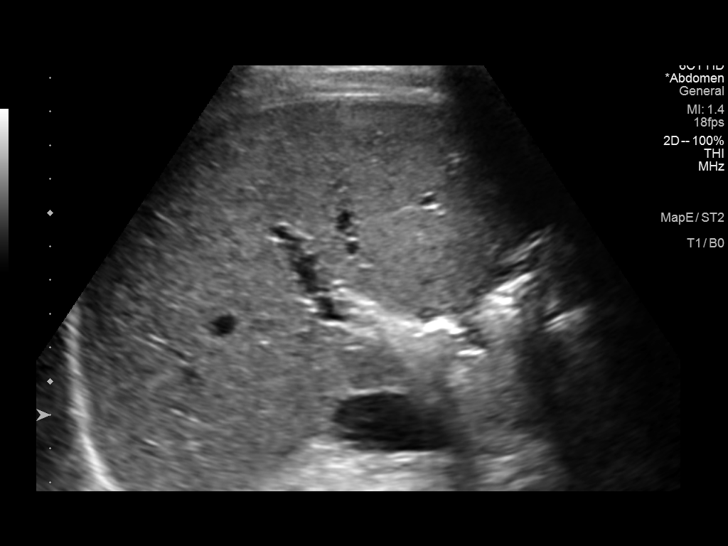
[im 46/46]
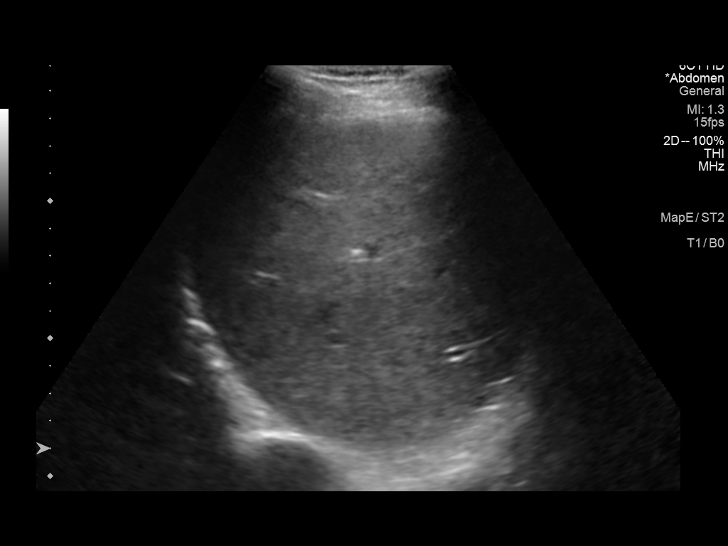

[14 of 25 positions shown; findings below may reference images not displayed]

FINDINGS: Gallbladder:

No gallstones or wall thickening visualized. No sonographic Murphy
sign noted by sonographer.

Common bile duct:

Diameter: 3 mm which is within normal limits.

Liver:

No focal lesion identified. Within normal limits in parenchymal
echogenicity. Portal vein is patent on color Doppler imaging with
normal direction of blood flow towards the liver.

Other: None.
IMPRESSION: No abnormality seen in the right upper quadrant of the abdomen.

## 2022-06-27 ENCOUNTER — Ambulatory Visit (HOSPITAL_COMMUNITY): Payer: PRIVATE HEALTH INSURANCE | Admitting: Psychiatry

## 2022-06-28 ENCOUNTER — Ambulatory Visit (HOSPITAL_BASED_OUTPATIENT_CLINIC_OR_DEPARTMENT_OTHER): Payer: PRIVATE HEALTH INSURANCE | Admitting: Psychiatry

## 2022-06-28 ENCOUNTER — Encounter (HOSPITAL_COMMUNITY): Payer: Self-pay | Admitting: Psychiatry

## 2022-06-28 DIAGNOSIS — F330 Major depressive disorder, recurrent, mild: Secondary | ICD-10-CM

## 2022-06-28 DIAGNOSIS — F411 Generalized anxiety disorder: Secondary | ICD-10-CM

## 2022-06-28 MED ORDER — DULOXETINE HCL 30 MG PO CPEP: 30.0000 mg | ORAL_CAPSULE | Freq: Two times a day (BID) | ORAL | 2 refills | Status: DC

## 2022-06-28 MED ORDER — CLONAZEPAM 0.5 MG PO TABS: 0.5000 mg | ORAL_TABLET | Freq: Every day | ORAL | 0 refills | Status: DC | PRN

## 2022-06-28 MED ORDER — BUPROPION HCL ER (XL) 150 MG PO TB24: 150.0000 mg | ORAL_TABLET | ORAL | 2 refills | Status: DC

## 2022-06-28 NOTE — Progress Notes (Signed)
Psychiatric Initial Adult Assessment   Patient Identification: Amanda Daugherty MRN:  [id number] Date of Evaluation:  06/28/2022 Referral Source: PCP Chief Complaint:   Chief Complaint  Patient presents with   Establish Care   Visit Diagnosis:    ICD-10-CM   1. GAD (generalized anxiety disorder)  F41.1 DULoxetine (CYMBALTA) 30 MG capsule    buPROPion (WELLBUTRIN XL) 150 MG 24 hr tablet    clonazePAM (KLONOPIN) 0.5 MG tablet    2. Mild episode of recurrent major depressive disorder [facility])  F33.0        Assessment:  Amanda Daugherty is a 27 y.o. female with a history of GAD who presents virtually to [facility] for initial evaluation on 06/28/2022.  Patient reports symptoms of anxiety including racing thoughts, constant worry that she is unable to control, difficulty relaxing, restlessness, increased irritability, and feelings of imposter syndrome that is affecting her day to day. She has experienced panic attacks in the past. Patient also endorsed neurovegetative symptoms of depression including low mood, anhedonia, sleep disturbances, decreased appetite, difficulty concentrating, and fatigue. She denies any SI/HI or thoughts of self harm. Psychosocially patient is dealing with multiple stressors including her fiance living in another country and increased stress after starting a new job. She meets criteria for GAD and MDD with further neuropsych testing being needed to confirm ADHD. Patient would benefit from medication adjustments and connection with a therapist.   Plan: - Increase Cymbalta 30 mg QD - Start Wellbutrin 150 mg XL - Continue Klonopin 0.5 mg QD prn (uses 1-2 times a month) - Therapy referral - Neuropsych testing referral - Crisis resources reviewed - Follow up in a month  History of Present Illness:  Amanda Daugherty presents reporting that her PCP has been trying to have her see a psychiatrist/psychologist for a few years to manage her  anxiety, and develop her coping skills. Recently Amanda Daugherty notes that she has been going through a tough phase due to increased stressors with work and planning her wedding. Of note patient started a new job in October which is enjoyable, but stressful, and her fiance who is in [place] is experiencing visa troubles meaning the wedding has to be delayed until 2025.  Patient notes that her anxiety symptoms started around the time she turned 70 and has progressively gotten worse since then. She notes that high school and college were a struggle in part due to this and her own belief that she was not adequate. Patient noted that she would often compare herself to her older sister and feel that she was never measuring up academically or in athletics. In a sense she describes her anxiety as a feeling of imposter syndrome. Patient had always been hesitant to seek mental help care and initially was started on Klonopin through her OB as she went to live in [place] for 2 years after college. She found this helpful for her anxiety/panic attacks and the symptoms improved as she acclimated to the area. She was still there during Covid and had to deal with isolation secondary to that which led to her anxiety symptoms increasing. After returning to the [place] in addition to experiencing baseline anxiety/the feelings of imposter syndrome she also started to develop a degree of social anxiety. Her PCP had started her on Cymbalta around that time with some benefit. Over the past 6 months her psychosocial stressors increased after she changed jobs and had her wedding delayed. Currently she endorses symptoms of racing thoughts, constant worry  that she is not able to control, difficulty concentrating, becoming easily overwhelmed, increased fatigue, decreased motivation, and increased sleep. She had tried Cymbalta 40 mg but found it be over sedating and decreased the dose to 20 mg. Patient also takes Klonopin 0.5 mg prn, and has used it  about 1-2 times a month.  Treatment options going forward were discussed including medications and therapy. While she has had some benefit from the Cymbalta the symptoms are still not well controlled. We discussed increasing the dose to 30 mg as 40 mg had been over sedating which patient was open to. We also discussed started Wellbutrin as an adjunct for mood, energy, and attention concerns. Risks and benefits of these medications were discussed. Patient was also interested in ADHD screening and was referred for neuropsych testing. As for therapy she notes that the idea of talking to someone has been anxiety provoking, however she does feel like it could be beneficial. She was provided with therapy resources. Patient was encouraged to continue exercising regularly as she has found this can improve her mood symptoms.  Associated Signs/Symptoms: Depression Symptoms:  depressed mood, anhedonia, hypersomnia, fatigue, difficulty concentrating, anxiety, loss of energy/fatigue, disturbed sleep, (Hypo) Manic Symptoms:   denies Anxiety Symptoms:  Excessive Worry, Psychotic Symptoms:   denies PTSD Symptoms: NA  Past Psychiatric History: She denies any prior psychiatric hospitalizations or suicide attempts  Has tried Atarax for itching in the past. Currently on Cymbalta and Klonopin.  Denies any substance use other than one cup of caffeine a day or occasional social use.   Previous Psychotropic Medications: Yes   Substance Abuse History in the last 12 months:  No.  Consequences of Substance Abuse: NA  Past Medical History:  Past Medical History:  Diagnosis Date   Chronic idiopathic constipation    GI-- dr stark   Endometriosis    Family history of protein S deficiency    pt's mother   History of ITP    IBS (irritable bowel syndrome)    Migraines    Pelvic pain    Protein S deficiency [facility])    hematology/ oncology--- dr Irene Limbo   (06-06-2020  per pt has never had clot)   Protein S  deficiency ([place])     Past Surgical History:  Procedure Laterality Date   LAPAROSCOPIC ENDOMETRIOSIS FULGURATION     LAPAROSCOPY N/A 06/09/2020   Procedure: LAPAROSCOPY DIAGNOSTIC, CAUTERY OF ENDOMETRIOSIS;  Surgeon: Arvella Nigh, MD;  Location: [facility];  Service: Gynecology;  Laterality: N/A;   TONSILLECTOMY  2017    Family Psychiatric History: Her paternal uncle had schizpohrenia, her mom has   Family History:  Family History  Problem Relation Age of Onset   Healthy Mother    Healthy Father    Hyperlipidemia Father    Colon cancer Maternal Grandmother    Uterine cancer Maternal Grandmother    Diabetes Maternal Grandfather    Heart disease Maternal Grandfather    Heart disease Paternal Grandfather    Melanoma Paternal Grandfather    Liver disease Neg Hx    Pancreatic cancer Neg Hx    Esophageal cancer Neg Hx    Stomach cancer Neg Hx     Social History:   Social History   Socioeconomic History   Marital status: Single    Spouse name: Not on file   Number of children: Not on file   Years of education: Not on file   Highest education level: Not on file  Occupational History  Not on file  Tobacco Use   Smoking status: Never    Passive exposure: Never   Smokeless tobacco: Never  Vaping Use   Vaping Use: Never used  Substance and Sexual Activity   Alcohol use: Not Currently    Comment: social   Drug use: Never   Sexual activity: Not on file  Other Topics Concern   Not on file  Social History Narrative   Not on file   Social Determinants of Health   Financial Resource Strain: Not on file  Food Insecurity: Not on file  Transportation Needs: Not on file  Physical Activity: Not on file  Stress: Not on file  Social Connections: Not on file    Additional Social History: Patient is the middle of 3 kids with an older sister and younger brother. She reports a good childhood completing high school and then college. Patient is currently engaged to  be married though her fiance lives in [place] and is awaiting Psychiatric nurse. 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 Patient enjoys the job, though can find it stressful at times. She enjoys going to [employer], scuba diving, exercising, and traveling.  Allergies:  No Known Allergies  Metabolic Disorder Labs: No results found for: "HGBA1C", "MPG" No results found for: "PROLACTIN" Lab Results  Component Value Date   CHOL 191 03/22/2020   TRIG 73.0 03/22/2020   HDL 53.80 03/22/2020   CHOLHDL 4 03/22/2020   VLDL 14.6 03/22/2020   LDLCALC 123 (H) 03/22/2020   Lab Results  Component Value Date   TSH 0.68 05/14/2022    Therapeutic Level Labs: No results found for: "LITHIUM" No results found for: "CBMZ" No results found for: "VALPROATE"  Current Medications: Current Outpatient Medications  Medication Sig Dispense Refill   buPROPion (WELLBUTRIN XL) 150 MG 24 hr tablet Take 1 tablet (150 mg total) by mouth every morning. 30 tablet 2   DULoxetine (CYMBALTA) 30 MG capsule Take 1 capsule (30 mg total) by mouth 2 (two) times daily. 60 capsule 2   clindamycin-benzoyl peroxide (BENZACLIN) gel Apply topically 2 (two) times daily. 25 g 1   clonazePAM (KLONOPIN) 0.5 MG tablet Take 1 tablet (0.5 mg total) by mouth daily as needed for anxiety. Pt states she takes monthly as needed 30 tablet 0   EPINEPHrine 0.3 mg/0.3 mL IJ SOAJ injection Inject 0.3 mg into the muscle as needed for anaphylaxis. 1 each 1   levonorgestrel (KYLEENA) 19.5 MG IUD Kyleena 17.5 mcg/24 hrs (58yrs) 19.5mg  intrauterine device  Take by intrauterine route.     linaclotide (LINZESS) 290 MCG CAPS capsule Take 1 capsule (290 mcg total) by mouth daily before breakfast. 90 capsule 3   Multiple Vitamin (MULTIVITAMIN ADULT PO) Take by mouth daily.     OVER THE COUNTER MEDICATION      Probiotic Product (PROBIOTIC DAILY) CAPS Take by mouth daily.     rivaroxaban (XARELTO) 10 MG TABS tablet Start taking 10mg   po once daily 24hours before and continue for 48 hours after any long distance travel exceeding 4-6 hours for DVT prophylaxis in context of protein S deficiency. 30 tablet 0   rizatriptan (MAXALT) 10 MG tablet Take 1 tablet (10 mg total) by mouth as needed for migraine. May repeat in 2 hours if needed 10 tablet 1   No current facility-administered medications for this visit.    Psychiatric Specialty Exam: Review of Systems  There were no vitals taken for this visit.There is no height or weight on  file to calculate BMI.  General Appearance: Well Groomed  Eye Contact:  Good  Speech:  Clear and Coherent and Normal Rate  Volume:  Normal  Mood:  Anxious  Affect:  Appropriate  Thought Process:  Coherent, Goal Directed, and Linear  Orientation:  Full (Time, Place, and Person)  Thought Content:  Logical  Suicidal Thoughts:  No  Homicidal Thoughts:  No  Memory:  Immediate;   Good  Judgement:  Good  Insight:  Fair  Psychomotor Activity:  Normal  Concentration:  Concentration: Fair  Recall:  [place] of Knowledge:Fair  Language: Good  Akathisia:  NA    AIMS (if indicated):  not done  Assets:  Communication Skills Desire for Improvement Financial Resources/Insurance [place] Talents/Skills Transportation Vocational/Educational  ADL's:  Intact  Cognition: WNL  Sleep:  Fair   Screenings: GAD-7    Personnel officer Visit from 03/23/2021 in [facility] at [facility] Visit from 03/21/2020 in [facility] [place] at [facility]  Total GAD-7 Score 13 7      PHQ2-9    [place] Visit from 05/10/2022 in [facility] at [facility] Visit from 03/23/2021 in [facility] from 03/21/2020 in [facility] at [facility]  PHQ-2 Total Score 4 2 2   PHQ-9 Total Score 15 12 5       Flowsheet Row  Admission (Discharged) from 06/09/2020 in [facility]  C-SSRS RISK CATEGORY No Risk (P)         Collaboration of Care: Medication Management AEB medication prescription, Primary Care Provider AEB chart review, and Referral or follow-up with counselor/therapist AEB referral  Patient/Guardian was advised Release of Information must be obtained prior to any record release in order to collaborate their care with an outside provider. Patient/Guardian was advised if they have not already done so to contact the registration department to sign all necessary forms in order for [place] to release information regarding their care.   Consent: Patient/Guardian gives verbal consent for treatment and assignment of benefits for services provided during this visit. Patient/Guardian expressed understanding and agreed to proceed.   Vista Mink, MD 3/21/20241:47 PM  75 minutes were spent in chart review, interview, psycho education, counseling, medical decision making, coordination of care and long-term prognosis.  Patient was given opportunity to ask question and all concerns and questions were addressed and answers. Excluding separately billable services.   Virtual Visit via Video Note  I connected with Avelino Leeds on 06/28/22 at 10:00 AM EDT by a video enabled telemedicine application and verified that I am speaking with the correct person using two identifiers.  Location: Patient: Home Provider: Home office   I discussed the limitations of evaluation and management by telemedicine and the availability of in person appointments. The patient expressed understanding and agreed to proceed.   I discussed the assessment and treatment plan with the patient. The patient was provided an opportunity to ask questions and all were answered. The patient agreed with the plan and demonstrated an understanding of the instructions.   The patient was advised to call back or seek an in-person evaluation if the symptoms  worsen or if the condition fails to improve as anticipated.  I provided 55 minutes of non-face-to-face time during this encounter.   Vista Mink, MD

## 2022-07-03 NOTE — Addendum Note (Signed)
Addended by: Charlette Caffey on: 07/03/2022 10:40 AM   Modules accepted: Orders

## 2022-07-04 ENCOUNTER — Telehealth (HOSPITAL_COMMUNITY): Payer: Self-pay | Admitting: *Deleted

## 2022-07-04 NOTE — Telephone Encounter (Signed)
Referrals for ADHD evaluation/testing sent to [facility], [facility], and [facility].

## 2022-07-31 ENCOUNTER — Encounter (HOSPITAL_COMMUNITY): Payer: Self-pay

## 2022-07-31 ENCOUNTER — Encounter (HOSPITAL_COMMUNITY): Payer: PRIVATE HEALTH INSURANCE | Admitting: Psychiatry

## 2022-07-31 NOTE — Progress Notes (Signed)
This encounter was created in error - please disregard.

## 2022-10-03 ENCOUNTER — Other Ambulatory Visit (HOSPITAL_COMMUNITY): Payer: Self-pay | Admitting: Psychiatry

## 2022-10-03 DIAGNOSIS — F411 Generalized anxiety disorder: Secondary | ICD-10-CM

## 2022-11-07 ENCOUNTER — Other Ambulatory Visit (HOSPITAL_COMMUNITY): Payer: Self-pay | Admitting: Psychiatry

## 2022-11-07 DIAGNOSIS — F411 Generalized anxiety disorder: Secondary | ICD-10-CM

## 2022-11-22 LAB — HM PAP SMEAR

## 2023-01-14 ENCOUNTER — Encounter: Payer: Self-pay | Admitting: Nurse Practitioner

## 2023-01-20 ENCOUNTER — Other Ambulatory Visit (HOSPITAL_COMMUNITY): Payer: Self-pay | Admitting: Psychiatry

## 2023-01-20 DIAGNOSIS — F411 Generalized anxiety disorder: Secondary | ICD-10-CM

## 2023-01-26 ENCOUNTER — Encounter: Payer: Self-pay | Admitting: Nurse Practitioner

## 2023-01-26 DIAGNOSIS — G43009 Migraine without aura, not intractable, without status migrainosus: Secondary | ICD-10-CM

## 2023-01-27 ENCOUNTER — Encounter (HOSPITAL_COMMUNITY): Payer: Self-pay

## 2023-01-30 MED ORDER — RIZATRIPTAN BENZOATE 10 MG PO TABS: 10.0000 mg | ORAL_TABLET | ORAL | 10 refills | Status: AC | PRN

## 2023-01-31 NOTE — Progress Notes (Signed)
BH MD/PA/NP OP Progress Note  02/01/2023 10:02 AM Amanda Daugherty  MRN:  [id number]  Visit Diagnosis:    ICD-10-CM   1. Mild episode of recurrent major depressive disorder (HCC)  F33.0 buPROPion (WELLBUTRIN XL) 150 MG 24 hr tablet    DULoxetine (CYMBALTA) 30 MG capsule    traZODone (DESYREL) 50 MG tablet    2. GAD (generalized anxiety disorder)  F41.1 buPROPion (WELLBUTRIN XL) 150 MG 24 hr tablet    DULoxetine (CYMBALTA) 30 MG capsule    traZODone (DESYREL) 50 MG tablet      Assessment: Amanda Daugherty is a 27 y.o. female with a history of GAD who presented to [facility] at [facility] for initial evaluation on 06/28/2022.  At initial evaluation patient reported symptoms of anxiety including racing thoughts, constant worry that she is unable to control, difficulty relaxing, restlessness, increased irritability, and feelings of imposter syndrome that is affecting her day to day. She has experienced panic attacks in the past. Patient also endorsed neurovegetative symptoms of depression including low mood, anhedonia, sleep disturbances, decreased appetite, difficulty concentrating, and fatigue. She denied any SI/HI or thoughts of self harm. Psychosocially patient is dealing with multiple stressors including her fiance living in another country and increased stress after starting a new job. She met criteria for GAD and MDD with further neuropsych testing being needed to confirm ADHD.  Amanda Daugherty presents for follow-up evaluation. Today, 02/01/23, patient reports worsening anxiety and depression over the past month.  She had noticed some initial benefits from the Wellbutrin however discontinue the medication after running out a few months ago.  Patient has also only been taking Cymbalta 30 mg daily.  She had endorsed some emotional flattening while on Wellbutrin though denied this being overtly detrimental and felt the benefits outweigh the cons.  Patient has increased  her Klonopin use in the interim typically at night to help with insomnia.  We will start trazodone as first-line for insomnia today.  Risk and benefits were discussed.  Also suggested light therapy options to help more with the travel and time spent in places with dreary climates.  We will restart Wellbutrin today and continue on Cymbalta 30 mg daily.  We also discussed therapy the patient like to hold off until connected with new insurance.  We will follow up in a month.  Plan: - Continue Cymbalta 30 mg QD - Continue Wellbutrin 150 mg XL - start Trazodone 25 mg QHS - Continue Klonopin 0.5 mg QD prn (freq increased to nightly the last month) - Recommended light therapy - Therapy referral once new insurance - Neuropsych testing referral - Crisis resources reviewed - Follow up in a month   Chief Complaint:  Chief Complaint  Patient presents with   Follow-up   HPI: Amanda Daugherty presents reporting that life has been normal but busy over the past 6 months. She has been busy traveling for work (all over [place] and [place]) and at this point she is only in [place] for short periods now. When she does have time off work she tends to go and see her fiance in the [place]. While in the [place] Amanda Daugherty works her [employer] hours from 2pm-10pm, which she finds difficult.  Patient notes that is hard to switch her mind off after work and go to sleep.  This long distance relationship has been made more difficult as fianc is not allowed to come to the [place] father in the process of getting a marriage visa.  The visa process was only said to take 6 to 8 months but now they are going on 2 years with no end in sight.  Recently she has felt that her anxiety and depressive symptoms are increasing with the last month being exceptionally hard.  They have also had to postpone their wedding plans twice during this process.  Amanda Daugherty is unsure whether she can continue on like this and is looking for any way to speed up the process.  She  endorses concerns about her age and medical history of endometriosis which could impact her ability to have kids.  Furthermore her partner is 62 years older and she does not want him to be an overly old father.  The childbearing piece is a big concern as it is one of her main aspirations that she feels is in jeopardy currently.    Patient reports that she has had unintentional weight loss of 12 pounds in the last month due to the excessive stress which has in turn caused nausea.  Patient has also found herself isolating more and not interacting or being outgoing as she had been in the past.  She does still being on her family for support she is not often with them.  As for her friend she will rarely see them and when she does she avoids addressing this topic.  1 suggestion that her lawyer made was to submit as an appeal due to impact on her mental health.  We discussed this option and explained that it be difficult to write any appeal at this time due to the provider's limited interaction with the patient.  This could be of further discussion in the future assuming we establish a longer relationship with more evidence of impact on her mental wellbeing.  In regards to her medications Amanda Daugherty notes that she has continued to take the Cymbalta at 30 mg once a day.  As for the Wellbutrin she did not start until a month after our last appointment.  Upon starting it she noted some improvement in mood symptoms along with some emotional flattening.  At that time the emotional flattening was seen more as a benefit as the excessive tearfulness she had resolved.  Patient rehab medication a few months ago and has not taken it since.  She does report continuing to take the Cymbalta daily.  As for the Klonopin she notes that his usage has increased particularly at night.  Insomnia has been something she struggled with more recently.  We will restart Wellbutrin today and discussed alternative sleep medications.  Patient was  open to trying trazodone as a first-line for sleep initiation.  Risk and benefits of this medication were reviewed.  Past Psychiatric History: She denies any prior psychiatric hospitalizations or suicide attempts  Has tried Atarax for itching in the past. Currently on Cymbalta and Klonopin.  Denies any substance use other than one cup of caffeine a day or occasional social use.   Past Medical History:  Past Medical History:  Diagnosis Date   Chronic idiopathic constipation    GI-- dr stark   Endometriosis    Family history of protein S deficiency    pt's mother   History of ITP    IBS (irritable bowel syndrome)    Migraines    Pelvic pain    Protein S deficiency [facility])    hematology/ oncology--- dr Candise Che   (06-06-2020  per pt has never had clot)   Protein S deficiency (HCC)     Past  Surgical History:  Procedure Laterality Date   LAPAROSCOPIC ENDOMETRIOSIS FULGURATION     LAPAROSCOPY N/A 06/09/2020   Procedure: LAPAROSCOPY DIAGNOSTIC, CAUTERY OF ENDOMETRIOSIS;  Surgeon: Richardean Chimera, MD;  Location: [facility];  Service: Gynecology;  Laterality: N/A;   TONSILLECTOMY  2017   Family History:  Family History  Problem Relation Age of Onset   Healthy Mother    Healthy Father    Hyperlipidemia Father    Colon cancer Maternal Grandmother    Uterine cancer Maternal Grandmother    Diabetes Maternal Grandfather    Heart disease Maternal Grandfather    Heart disease Paternal Grandfather    Melanoma Paternal Grandfather    Liver disease Neg Hx    Pancreatic cancer Neg Hx    Esophageal cancer Neg Hx    Stomach cancer Neg Hx     Social History:  Social History   Socioeconomic History   Marital status: Single    Spouse name: Not on file   Number of children: Not on file   Years of education: Not on file   Highest education level: Not on file  Occupational History   Not on file  Tobacco Use   Smoking status: Never    Passive exposure: Never   Smokeless  tobacco: Never  Vaping Use   Vaping status: Never Used  Substance and Sexual Activity   Alcohol use: Not Currently    Comment: social   Drug use: Never   Sexual activity: Not on file  Other Topics Concern   Not on file  Social History Narrative   Not on file   Social Determinants of Health   Financial Resource Strain: Not on file  Food Insecurity: Not on file  Transportation Needs: Not on file  Physical Activity: Not on file  Stress: Not on file  Social Connections: Not on file    Allergies: No Known Allergies  Current Medications: Current Outpatient Medications  Medication Sig Dispense Refill   traZODone (DESYREL) 50 MG tablet Take 0.5 tablets (25 mg total) by mouth at bedtime. 45 tablet 1   buPROPion (WELLBUTRIN XL) 150 MG 24 hr tablet Take 1 tablet (150 mg total) by mouth every morning. 90 tablet 0   clindamycin-benzoyl peroxide (BENZACLIN) gel Apply topically 2 (two) times daily. 25 g 1   clonazePAM (KLONOPIN) 0.5 MG tablet Take 1 tablet (0.5 mg total) by mouth daily as needed for anxiety. Pt states she takes monthly as needed 30 tablet 0   DULoxetine (CYMBALTA) 30 MG capsule Take 1 capsule (30 mg total) by mouth daily. 90 capsule 0   EPINEPHrine 0.3 mg/0.3 mL IJ SOAJ injection Inject 0.3 mg into the muscle as needed for anaphylaxis. 1 each 1   levonorgestrel (KYLEENA) 19.5 MG IUD Kyleena 17.5 mcg/24 hrs (53yrs) 19.5mg  intrauterine device  Take by intrauterine route.     linaclotide (LINZESS) 290 MCG CAPS capsule Take 1 capsule (290 mcg total) by mouth daily before breakfast. 90 capsule 3   Multiple Vitamin (MULTIVITAMIN ADULT PO) Take by mouth daily.     OVER THE COUNTER MEDICATION      Probiotic Product (PROBIOTIC DAILY) CAPS Take by mouth daily.     rivaroxaban (XARELTO) 10 MG TABS tablet Start taking 10mg  po once daily 24hours before and continue for 48 hours after any long distance travel exceeding 4-6 hours for DVT prophylaxis in context of protein S deficiency. 30  tablet 0   rizatriptan (MAXALT) 10 MG tablet Take 1 tablet (10 mg total)  by mouth as needed for migraine. May repeat in 2 hours if needed 10 tablet 10   No current facility-administered medications for this visit.     Psychiatric Specialty Exam: Review of Systems  There were no vitals taken for this visit.There is no height or weight on file to calculate BMI.  General Appearance: Fairly Groomed  Eye Contact:  Fair  Speech:  Clear and Coherent  Volume:  Normal  Mood:  Anxious and Depressed  Affect:  Appropriate and Congruent  Thought Process:  Coherent and Goal Directed  Orientation:  Full (Time, Place, and Person)  Thought Content: Logical   Suicidal Thoughts:  No  Homicidal Thoughts:  No  Memory:  Immediate;   Good  Judgement:  Fair  Insight:  Fair  Psychomotor Activity:  Decreased  Concentration:  Concentration: Fair  Recall:  Fair  Fund of Knowledge: Fair  Language: Good  Akathisia:  NA    AIMS (if indicated): not done  Assets:  Communication Skills Desire for Improvement Housing Vocational/Educational  ADL's:  Intact  Cognition: WNL  Sleep:  Poor   Metabolic Disorder Labs: No results found for: "HGBA1C", "MPG" No results found for: "PROLACTIN" Lab Results  Component Value Date   CHOL 191 03/22/2020   TRIG 73.0 03/22/2020   HDL 53.80 03/22/2020   CHOLHDL 4 03/22/2020   VLDL 14.6 03/22/2020   LDLCALC 123 (H) 03/22/2020   Lab Results  Component Value Date   TSH 0.68 05/14/2022   TSH 1.15 03/23/2021    Therapeutic Level Labs: No results found for: "LITHIUM" No results found for: "VALPROATE" No results found for: "CBMZ"   Screenings: GAD-7    Flowsheet Row Office Visit from 03/23/2021 in [facility] [place] [facility] [employer] Visit from 03/21/2020 in [facility] [employer]  Total GAD-7 Score 13 7      PHQ2-9    Flowsheet Row Office Visit from 05/10/2022 in [facility]  [facility] Visit from 03/23/2021 in [facility] [place] [facility] [employer] Visit from 03/21/2020 in [facility] [facility] [employer]  PHQ-2 Total Score 4 2 2   PHQ-9 Total Score 15 12 5       Flowsheet Row Admission (Discharged) from 06/09/2020 in [facility]  C-SSRS RISK CATEGORY No Risk (P)        Collaboration of Care: Collaboration of Care: Medication Management AEB medication prescription and Other provider involved in patient's care AEB OB chart review  Patient/Guardian was advised Release of Information must be obtained prior to any record release in order to collaborate their care with an outside provider. Patient/Guardian was advised if they have not already done so to contact the registration department to sign all necessary forms in order for [place] to release information regarding their care.   Consent: Patient/Guardian gives verbal consent for treatment and assignment of benefits for services provided during this visit. Patient/Guardian expressed understanding and agreed to proceed.    Stasia Cavalier, MD 02/01/2023, 10:02 AM   Virtual Visit via Video Note  I connected with Amanda Daugherty on 02/01/23 at  8:00 AM EDT by a video enabled telemedicine application and verified that I am speaking with the correct person using two identifiers.  Location: Patient: Home Provider: Home Office   I discussed the limitations of evaluation and management by telemedicine and the availability of in person appointments. The patient expressed understanding and agreed to proceed.   I discussed the assessment and  treatment plan with the patient. The patient was provided an opportunity to ask questions and all were answered. The patient agreed with the plan and demonstrated an understanding of the instructions.   The patient was advised to call back or seek an in-person evaluation if the symptoms worsen or if the condition fails to improve as  anticipated.  I provided 30 minutes of non-face-to-face time during this encounter.   Stasia Cavalier, MD

## 2023-02-01 ENCOUNTER — Encounter (HOSPITAL_COMMUNITY): Payer: Self-pay | Admitting: Psychiatry

## 2023-02-01 ENCOUNTER — Telehealth (HOSPITAL_BASED_OUTPATIENT_CLINIC_OR_DEPARTMENT_OTHER): Payer: Self-pay | Admitting: Psychiatry

## 2023-02-01 DIAGNOSIS — F330 Major depressive disorder, recurrent, mild: Secondary | ICD-10-CM

## 2023-02-01 DIAGNOSIS — F411 Generalized anxiety disorder: Secondary | ICD-10-CM

## 2023-02-01 MED ORDER — TRAZODONE HCL 50 MG PO TABS: 25.0000 mg | ORAL_TABLET | Freq: Every day | ORAL | 1 refills | Status: DC

## 2023-02-01 MED ORDER — DULOXETINE HCL 30 MG PO CPEP: 30.0000 mg | ORAL_CAPSULE | Freq: Every day | ORAL | 0 refills | Status: DC

## 2023-02-01 MED ORDER — BUPROPION HCL ER (XL) 150 MG PO TB24: 150.0000 mg | ORAL_TABLET | ORAL | 0 refills | Status: DC

## 2023-03-11 NOTE — Progress Notes (Unsigned)
BH MD/PA/NP OP Progress Note  03/12/2023 12:36 PM Amanda Daugherty  MRN:  [id number]  Visit Diagnosis:    ICD-10-CM   1. GAD (generalized anxiety disorder)  F41.1 clonazePAM (KLONOPIN) 0.5 MG tablet    traZODone (DESYREL) 150 MG tablet    2. Mild episode of recurrent major depressive disorder (HCC)  F33.0 traZODone (DESYREL) 150 MG tablet     Assessment: Amanda Daugherty is a 27 y.o. female with a history of GAD who presented to [facility] for initial evaluation on 06/28/2022.  At initial evaluation patient reported symptoms of anxiety including racing thoughts, constant worry that she is unable to control, difficulty relaxing, restlessness, increased irritability, and feelings of imposter syndrome that is affecting her day to day. She has experienced panic attacks in the past. Patient also endorsed neurovegetative symptoms of depression including low mood, anhedonia, sleep disturbances, decreased appetite, difficulty concentrating, and fatigue. She denied any SI/HI or thoughts of self harm. Psychosocially patient is dealing with multiple stressors including her fiance living in another country and increased stress after starting a new job. She met criteria for GAD and MDD with further neuropsych testing being needed to confirm ADHD.  Brayton Layman presents for follow-up evaluation. Today, 03/12/23, patient reports an increase in anxiety and depression secondary to continued psychosocial stressors.  Fianc's visa was denied and now patient is applying for a visa to move to the [place].  He also has a wedding date set for May 20 and would need to be living in the country for 70 days prior to that.  The sudden shift and plans has raised a lot of anxiety and worry.  Support and cognitive reframing around this was addressed today.  Titration of Cymbalta was also suggested however patient declined.  She denies any adverse effects from Cymbalta or Wellbutrin  at her current doses.  As for the trazodone she has not noticed any benefit and insomnia has become more extreme.  We will titrate trazodone 150 mg daily and continue Klonopin 0.5 mg nightly as needed.  Patient will follow up in a month.  Did discuss therapy resources in the [place] as well as the [place].  Plan: - Continue Cymbalta 30 mg QD - Continue Wellbutrin 150 mg XL - Increase Trazodone to 150 mg QHS - Continue Klonopin 0.5 mg QD prn (freq increased to nightly the last month) - Recommended light therapy - Therapy r resources discussed - Neuropsych testing referral - Crisis resources reviewed - Follow up in a month  Chief Complaint:  Chief Complaint  Patient presents with   Follow-up   HPI: Amanda Daugherty presents reporting that she continued to deal with anxiety shortly after our last appointment which has continued to grieve though has shifted more towards significant depressive symptoms.  The visa interview for her husband to come to the [place] did not go well and while he was not out right denied the application was placed into a limbo status with no definitive end date.  Due to this Amanda Daugherty has now started to look into the process of getting a Visa for the [place] and the 2 will get married there.  Patient has mixed feelings about this as she is looking forward to being with her partner, being married, and eventually having kids.  At the same time however she feels sadness about having to give up her family, friends, and potentially her career.  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  This split thinking has led to increased worry that she is being selfish on wanting to have these opportunities and delay joining her partner in the [place].  Simultaneously she is also worried that she may resent him if she gives up these opportunities.  Patient had briefly brought this  up with her partner on Friday and found the conversation to be tough in part because she was unable to fully think out her thoughts prior.  Amanda Daugherty notes that she tends to rehearse her points prior to having conversation.  After providing support and encouraging her to explore how she would feel if he was in this position patient was feeling more confident in the ability to express herself.  As the 2 have already spent 15 months apart doing 1 more month may be feasible especially if they meet in a different country over Christmas.  In regards to the mood symptoms patient reports that she is still going to work and trying to exercise daily.  Amanda Daugherty has found that her patient however has gotten significantly worse along with the onset of anhedonia.  Sleep has also been an issue as she has noticed no benefit from the trazodone even at 50 mg.  Around every third day she started to take the Klonopin to get herself to sleep.  The lack of sleep is in part related to the depression and in part related to the racing thoughts she is experiencing around this whole process.  We discussed the possibility of titrating Cymbalta however patient declined that at this time.  She was open to increasing the dose of trazodone 150 mg at bedtime.  We also did review the Klonopin and explained that while consistent long-term use is not ideal after an acute stressor it is okay to use it a bit more frequently.  Patient was also interested in therapy options.  As she may be moving to [place] in the next couple months we encouraged her to look at resources both in the [place] and there.  Past Psychiatric History: She denies any prior psychiatric hospitalizations or suicide attempts  Has tried Atarax for itching in the past. Currently on Cymbalta and Klonopin.  Denies any substance use other than one cup of caffeine a day or occasional social use.   Past Medical History:  Past Medical History:  Diagnosis Date   Chronic  idiopathic constipation    GI-- dr stark   Endometriosis    Family history of protein S deficiency    pt's mother   History of ITP    IBS (irritable bowel syndrome)    Migraines    Pelvic pain    Protein S deficiency [facility])    hematology/ oncology--- dr Candise Che   (06-06-2020  per pt has never had clot)   Protein S deficiency (HCC)     Past Surgical History:  Procedure Laterality Date   LAPAROSCOPIC ENDOMETRIOSIS FULGURATION     LAPAROSCOPY N/A 06/09/2020   Procedure: LAPAROSCOPY DIAGNOSTIC, CAUTERY OF ENDOMETRIOSIS;  Surgeon: Richardean Chimera, MD;  Location: [facility] ;  Service: Gynecology;  Laterality: N/A;   TONSILLECTOMY  2017   Family History:  Family History  Problem Relation Age of Onset   Healthy Mother    Healthy Father    Hyperlipidemia Father    Colon cancer Maternal Grandmother    Uterine cancer  Maternal Grandmother    Diabetes Maternal Grandfather    Heart disease Maternal Grandfather    Heart disease Paternal Grandfather    Melanoma Paternal Grandfather    Liver disease Neg Hx    Pancreatic cancer Neg Hx    Esophageal cancer Neg Hx    Stomach cancer Neg Hx     Social History:  Social History   Socioeconomic History   Marital status: Single    Spouse name: Not on file   Number of children: Not on file   Years of education: Not on file   Highest education level: Not on file  Occupational History   Not on file  Tobacco Use   Smoking status: Never    Passive exposure: Never   Smokeless tobacco: Never  Vaping Use   Vaping status: Never Used  Substance and Sexual Activity   Alcohol use: Not Currently    Comment: social   Drug use: Never   Sexual activity: Not on file  Other Topics Concern   Not on file  Social History Narrative   Not on file   Social Determinants of Health   Financial Resource Strain: Not on file  Food Insecurity: Not on file  Transportation Needs: Not on file  Physical Activity: Not on file  Stress: Not on file   Social Connections: Not on file    Allergies: No Known Allergies  Current Medications: Current Outpatient Medications  Medication Sig Dispense Refill   buPROPion (WELLBUTRIN XL) 150 MG 24 hr tablet Take 1 tablet (150 mg total) by mouth every morning. 90 tablet 0   clindamycin-benzoyl peroxide (BENZACLIN) gel Apply topically 2 (two) times daily. 25 g 1   clonazePAM (KLONOPIN) 0.5 MG tablet Take 1 tablet (0.5 mg total) by mouth daily as needed for anxiety. Pt states she takes monthly as needed 30 tablet 0   DULoxetine (CYMBALTA) 30 MG capsule Take 1 capsule (30 mg total) by mouth daily. 90 capsule 0   EPINEPHrine 0.3 mg/0.3 mL IJ SOAJ injection Inject 0.3 mg into the muscle as needed for anaphylaxis. 1 each 1   levonorgestrel (KYLEENA) 19.5 MG IUD Kyleena 17.5 mcg/24 hrs (91yrs) 19.5mg  intrauterine device  Take by intrauterine route.     linaclotide (LINZESS) 290 MCG CAPS capsule Take 1 capsule (290 mcg total) by mouth daily before breakfast. 90 capsule 3   Multiple Vitamin (MULTIVITAMIN ADULT PO) Take by mouth daily.     OVER THE COUNTER MEDICATION      Probiotic Product (PROBIOTIC DAILY) CAPS Take by mouth daily.     rivaroxaban (XARELTO) 10 MG TABS tablet Start taking 10mg  po once daily 24hours before and continue for 48 hours after any long distance travel exceeding 4-6 hours for DVT prophylaxis in context of protein S deficiency. 30 tablet 0   rizatriptan (MAXALT) 10 MG tablet Take 1 tablet (10 mg total) by mouth as needed for migraine. May repeat in 2 hours if needed 10 tablet 10   traZODone (DESYREL) 150 MG tablet Take 1 tablet (150 mg total) by mouth at bedtime. 45 tablet 1   No current facility-administered medications for this visit.     Psychiatric Specialty Exam: Review of Systems  There were no vitals taken for this visit.There is no height or weight on file to calculate BMI.  General Appearance: Fairly Groomed  Eye Contact:  Fair  Speech:  Clear and Coherent  Volume:   Normal  Mood:  Anxious and Depressed  Affect:  Appropriate and Congruent  Thought Process:  Coherent and Goal Directed  Orientation:  Full (Time, Place, and Person)  Thought Content: Logical   Suicidal Thoughts:  No  Homicidal Thoughts:  No  Memory:  Immediate;   Good  Judgement:  Fair  Insight:  Fair  Psychomotor Activity:  Decreased  Concentration:  Concentration: Fair  Recall:  Fair  Fund of Knowledge: Fair  Language: Good  Akathisia:  NA    AIMS (if indicated): not done  Assets:  Communication Skills Desire for Improvement Housing Vocational/Educational  ADL's:  Intact  Cognition: WNL  Sleep:  Poor   Metabolic Disorder Labs: No results found for: "HGBA1C", "MPG" No results found for: "PROLACTIN" Lab Results  Component Value Date   CHOL 191 03/22/2020   TRIG 73.0 03/22/2020   HDL 53.80 03/22/2020   CHOLHDL 4 03/22/2020   VLDL 14.6 03/22/2020   LDLCALC 123 (H) 03/22/2020   Lab Results  Component Value Date   TSH 0.68 05/14/2022   TSH 1.15 03/23/2021    Therapeutic Level Labs: No results found for: "LITHIUM" No results found for: "VALPROATE" No results found for: "CBMZ"   Screenings: GAD-7    Flowsheet Row Office Visit from 03/23/2021 in [facility] [employer] Visit from 03/21/2020 in [facility] [employer]  Total GAD-7 Score 13 7      PHQ2-9    Flowsheet Row Office Visit from 05/10/2022 in [facility] Visit from 03/23/2021 in [facility] [employer] Visit from 03/21/2020 in [facility] [employer]  PHQ-2 Total Score 4 2 2   PHQ-9 Total Score 15 12 5       Flowsheet Row Admission (Discharged) from 06/09/2020 in [facility]  C-SSRS RISK CATEGORY No Risk (P)        Collaboration of Care: Collaboration of Care: Medication Management AEB medication prescription and Other provider  involved in patient's care AEB OB chart review  Patient/Guardian was advised Release of Information must be obtained prior to any record release in order to collaborate their care with an outside provider. Patient/Guardian was advised if they have not already done so to contact the registration department to sign all necessary forms in order for [place] to release information regarding their care.   Consent: Patient/Guardian gives verbal consent for treatment and assignment of benefits for services provided during this visit. Patient/Guardian expressed understanding and agreed to proceed.    Stasia Cavalier, MD 03/12/2023, 12:36 PM   Virtual Visit via Video Note  I connected with Corena Herter on 03/12/23 at  8:00 AM EST by a video enabled telemedicine application and verified that I am speaking with the correct person using two identifiers.  Location: Patient: Home Provider: Home Office   I discussed the limitations of evaluation and management by telemedicine and the availability of in person appointments. The patient expressed understanding and agreed to proceed.   I discussed the assessment and treatment plan with the patient. The patient was provided an opportunity to ask questions and all were answered. The patient agreed with the plan and demonstrated an understanding of the instructions.   The patient was advised to call back or seek an in-person evaluation if the symptoms worsen or if the condition fails to improve as anticipated.  40 minutes were spent in chart review, interview, psycho education, counseling, medical decision making, coordination of care and long-term prognosis.  Patient was given opportunity to ask question  and all concerns and questions were addressed and answers. Excluding separately billable services.   Stasia Cavalier, MD

## 2023-03-12 ENCOUNTER — Telehealth (HOSPITAL_COMMUNITY): Payer: PRIVATE HEALTH INSURANCE | Admitting: Psychiatry

## 2023-03-12 ENCOUNTER — Encounter (HOSPITAL_COMMUNITY): Payer: Self-pay | Admitting: Psychiatry

## 2023-03-12 DIAGNOSIS — F330 Major depressive disorder, recurrent, mild: Secondary | ICD-10-CM | POA: Diagnosis not present

## 2023-03-12 DIAGNOSIS — F411 Generalized anxiety disorder: Secondary | ICD-10-CM | POA: Diagnosis not present

## 2023-03-12 MED ORDER — CLONAZEPAM 0.5 MG PO TABS: 0.5000 mg | ORAL_TABLET | Freq: Every day | ORAL | 0 refills | Status: DC | PRN

## 2023-03-12 MED ORDER — TRAZODONE HCL 150 MG PO TABS: 150.0000 mg | ORAL_TABLET | Freq: Every day | ORAL | 1 refills | Status: DC

## 2023-03-12 NOTE — Addendum Note (Signed)
Addended by: Everlena Cooper on: 03/12/2023 12:45 PM   Modules accepted: Level of Service

## 2023-04-17 NOTE — Progress Notes (Signed)
 BH MD/PA/NP OP Progress Note  04/19/2023 9:30 AM Amanda Daugherty  MRN:  [id number]  Visit Diagnosis:    ICD-10-CM   1. GAD (generalized anxiety disorder)  F41.1 DULoxetine  (CYMBALTA ) 30 MG capsule    buPROPion  (WELLBUTRIN  XL) 300 MG 24 hr tablet    traZODone  (DESYREL ) 150 MG tablet    2. Mild episode of recurrent major depressive disorder (HCC)  F33.0 DULoxetine  (CYMBALTA ) 30 MG capsule    buPROPion  (WELLBUTRIN  XL) 300 MG 24 hr tablet    traZODone  (DESYREL ) 150 MG tablet      Assessment: Amanda Daugherty is a 28 y.o. female with a history of GAD who presented to [facility] at [facility] for initial evaluation on 06/28/2022.  At initial evaluation patient reported symptoms of anxiety including racing thoughts, constant worry that she is unable to control, difficulty relaxing, restlessness, increased irritability, and feelings of imposter syndrome that is affecting her day to day. She has experienced panic attacks in the past. Patient also endorsed neurovegetative symptoms of depression including low mood, anhedonia, sleep disturbances, decreased appetite, difficulty concentrating, and fatigue. She denied any SI/HI or thoughts of self harm. Psychosocially patient is dealing with multiple stressors including her fiance living in another country and increased stress after starting a new job. She met criteria for GAD and MDD with further neuropsych testing being needed to confirm ADHD.  Amanda Daugherty presents for follow-up evaluation. Today, 04/19/23, patient reports continued anxiety though significant improvement in psychosocial stressors.  Her fianc did have his visa approved and will be coming to the country in the next couple days.  Her current anxiety is related to the required rapid timeline for them to get married.  She still endorses benefit from the Cymbalta  and Wellbutrin  noting decreased libido as the only adverse side effect.  For now she is open to titrating  the Wellbutrin  to try and manage this before opting to try an alternative antidepressant medication.  Patient has reported benefit from the trazodone  with her insomnia and required the Klonopin  use less frequently.  She used it around 3 times in the past month following a panic attack.  Patient also did we discussed the ADHD concerns.  She was rereferred for testing today.  We will follow up in 2 months.   an increase in anxiety and depression secondary to continued psychosocial stressors.  Fianc's visa was denied and now patient is applying for a [place].  He also has a wedding date set for May 20 and would need to be living in the country for 70 days prior to that.  The sudden shift and plans has raised a lot of anxiety and worry.  Support and cognitive reframing around this was addressed today.  Titration of Cymbalta  was also suggested however patient declined.  She denies any adverse effects from Cymbalta  or Wellbutrin  at her current doses.  As for the trazodone  she has not noticed any benefit and insomnia has become more extreme.  We will titrate trazodone  150 mg daily and continue Klonopin  0.5 mg nightly as needed.  Patient will follow up in a month.  Did discuss therapy resources in the [place] well as the [place].  Plan: - Continue Cymbalta  30 mg QD - Increase Wellbutrin  to 300 mg XL - Continue Trazodone  75-150 mg QHS - Continue Klonopin  0.5 mg QD prn for anxiety - Recommended light therapy - Therapy referral - Neuropsych testing referral - Crisis resources reviewed -  Follow up in a month  Chief Complaint:  Chief Complaint  Patient presents with   Follow-up   HPI: Amanda Daugherty presents reporting that she is doing much better. She just got the super exciting news that her husband got his [place] a couple days ago and he is going to be coming to the [place]  on Monday.  Amanda Daugherty reports that this finally feels that he is coming to an end over the past year and a half.  He is also  relieved that huge burden and dread she had about moving to the [place].  With his impending arrival and visa status they do have to get married in 90 days so she has stress about planning the wedding for 5/31.  Furthermore they also intend to buy a house in the next 6 months which had some stress.  At this time she does feel like the Cymbalta  and Wellbutrin  are still beneficial in controlling her anxiety symptoms and wants to stay on them.  The trazodone  has also been beneficial for sleep.  She takes the 75 mg tab when she gets up at 6 AM for the gym and the 150 mg tab when she does not need to get up in the morning.  She has needed the Klonopin  much less frequently reporting only a few days use around the holidays when she had a panic attack.  In the future she may want to reassess the Cymbalta  as she has experienced some decreased libido from the medication.  For now she was open to trying a titration of Wellbutrin  to help manage this and to target her suspected ADHD symptoms.  If no improvement in the below concerns then we could consider alternative medications to Cymbalta .  Patient will also be referred to a therapist now that she is standing in the [place] .  In regards to the ADHD symptoms patient reports struggling with poor focus and inattention.  She particularly notices during conversations where she can lose the red of the topic and want to interrupt people with her own thoughts.  Patient had been referred for ADHD testing in the past though had not reached out.  We will rereferral today.  Past Psychiatric History: She denies any prior psychiatric hospitalizations or suicide attempts  Has tried Atarax  for itching in the past. Currently on Cymbalta  and Klonopin .  Denies any substance use other than one cup of caffeine a day or occasional social use.   Past Medical History:  Past Medical History:  Diagnosis Date   Chronic idiopathic constipation    GI-- dr stark   Endometriosis    Family history of  protein S deficiency    pt's mother   History of ITP    IBS (irritable bowel syndrome)    Migraines    Pelvic pain    Protein S deficiency [facility])    hematology/ oncology--- dr onesimo   (06-06-2020  per pt has never had clot)   Protein S deficiency (HCC)     Past Surgical History:  Procedure Laterality Date   LAPAROSCOPIC ENDOMETRIOSIS FULGURATION     LAPAROSCOPY N/A 06/09/2020   Procedure: LAPAROSCOPY DIAGNOSTIC, CAUTERY OF ENDOMETRIOSIS;  Surgeon: Leva Rush, MD;  Location: [facility];  Service: Gynecology;  Laterality: N/A;   TONSILLECTOMY  2017   Family History:  Family History  Problem Relation Age of Onset   Healthy Mother    Healthy Father    Hyperlipidemia Father    Colon cancer Maternal Grandmother    Uterine cancer  Maternal Grandmother    Diabetes Maternal Grandfather    Heart disease Maternal Grandfather    Heart disease Paternal Grandfather    Melanoma Paternal Grandfather    Liver disease Neg Hx    Pancreatic cancer Neg Hx    Esophageal cancer Neg Hx    Stomach cancer Neg Hx     Social History:  Social History   Socioeconomic History   Marital status: Single    Spouse name: Not on file   Number of children: Not on file   Years of education: Not on file   Highest education level: Not on file  Occupational History   Not on file  Tobacco Use   Smoking status: Never    Passive exposure: Never   Smokeless tobacco: Never  Vaping Use   Vaping status: Never Used  Substance and Sexual Activity   Alcohol use: Not Currently    Comment: social   Drug use: Never   Sexual activity: Not on file  Other Topics Concern   Not on file  Social History Narrative   Not on file   Social Drivers of Health   Financial Resource Strain: Not on file  Food Insecurity: Not on file  Transportation Needs: Not on file  Physical Activity: Not on file  Stress: Not on file  Social Connections: Not on file    Allergies: No Known Allergies  Current  Medications: Current Outpatient Medications  Medication Sig Dispense Refill   buPROPion  (WELLBUTRIN  XL) 300 MG 24 hr tablet Take 1 tablet (300 mg total) by mouth every morning. 90 tablet 0   clindamycin -benzoyl peroxide (BENZACLIN) gel Apply topically 2 (two) times daily. 25 g 1   clonazePAM  (KLONOPIN ) 0.5 MG tablet Take 1 tablet (0.5 mg total) by mouth daily as needed for anxiety. Pt states she takes monthly as needed 30 tablet 0   DULoxetine  (CYMBALTA ) 30 MG capsule Take 1 capsule (30 mg total) by mouth daily. 90 capsule 0   EPINEPHrine  0.3 mg/0.3 mL IJ SOAJ injection Inject 0.3 mg into the muscle as needed for anaphylaxis. 1 each 1   levonorgestrel (KYLEENA) 19.5 MG IUD Kyleena 17.5 mcg/24 hrs (69yrs) 19.5mg  intrauterine device  Take by intrauterine route.     linaclotide  (LINZESS ) 290 MCG CAPS capsule Take 1 capsule (290 mcg total) by mouth daily before breakfast. 90 capsule 3   Multiple Vitamin (MULTIVITAMIN ADULT PO) Take by mouth daily.     OVER THE COUNTER MEDICATION      Probiotic Product (PROBIOTIC DAILY) CAPS Take by mouth daily.     rivaroxaban  (XARELTO ) 10 MG TABS tablet Start taking 10mg  po once daily 24hours before and continue for 48 hours after any long distance travel exceeding 4-6 hours for DVT prophylaxis in context of protein S deficiency. 30 tablet 0   rizatriptan  (MAXALT ) 10 MG tablet Take 1 tablet (10 mg total) by mouth as needed for migraine. May repeat in 2 hours if needed 10 tablet 10   traZODone  (DESYREL ) 150 MG tablet Take 1 tablet (150 mg total) by mouth at bedtime. 45 tablet 1   No current facility-administered medications for this visit.     Psychiatric Specialty Exam: Review of Systems  There were no vitals taken for this visit.There is no height or weight on file to calculate BMI.  General Appearance: Fairly Groomed  Eye Contact:  Fair  Speech:  Clear and Coherent  Volume:  Normal  Mood:  Anxious and Depressed  Affect:  Appropriate and Congruent  Thought Process:  Coherent and Goal Directed  Orientation:  Full (Time, Place, and Person)  Thought Content: Logical   Suicidal Thoughts:  No  Homicidal Thoughts:  No  Memory:  Immediate;   Good  Judgement:  Fair  Insight:  Fair  Psychomotor Activity:  Decreased  Concentration:  Concentration: Fair  Recall:  Fair  Fund of Knowledge: Fair  Language: Good  Akathisia:  NA    AIMS (if indicated): not done  Assets:  Communication Skills Desire for Improvement Housing Vocational/Educational  ADL's:  Intact  Cognition: WNL  Sleep:  Poor   Metabolic Disorder Labs: No results found for: HGBA1C, MPG No results found for: PROLACTIN Lab Results  Component Value Date   CHOL 191 03/22/2020   TRIG 73.0 03/22/2020   HDL 53.80 03/22/2020   CHOLHDL 4 03/22/2020   VLDL 14.6 03/22/2020   LDLCALC 123 (H) 03/22/2020   Lab Results  Component Value Date   TSH 0.68 05/14/2022   TSH 1.15 03/23/2021    Therapeutic Level Labs: No results found for: LITHIUM No results found for: VALPROATE No results found for: CBMZ   Screenings: GAD-7    Flowsheet Row Office Visit from [facility]/23/2021 in [facility] [employer] Visit from 03/21/2020 in [facility] [employer]  Total GAD-7 Score 13 7      PHQ2-9    Flowsheet Row Office Visit from 05/10/2022 in [facility] [address] Visit from 03/23/2021 in [facility] [employer] Visit from 03/21/2020 in [facility] [employer]  PHQ-2 Total Score 4 2 2   PHQ-9 Total Score 15 12 5       Flowsheet Row Admission (Discharged) from 06/09/2020 in [facility]  C-SSRS RISK CATEGORY No Risk (P)        Collaboration of Care: Collaboration of Care: Medication Management AEB medication prescription and Other provider involved in patient's care AEB OB chart review  Patient/Guardian was  advised Release of Information must be obtained prior to any record release in order to collaborate their care with an outside provider. Patient/Guardian was advised if they have not already done so to contact the registration department to sign all necessary forms in order for us  to release information regarding their care.   Consent: Patient/Guardian gives verbal consent for treatment and assignment of benefits for services provided during this visit. Patient/Guardian expressed understanding and agreed to proceed.    Arvella CHRISTELLA Finder, MD 04/19/2023, 9:30 AM   Virtual Visit via Video Note  I connected with Amanda Daugherty on 04/19/23 at  8:30 AM EST by a video enabled telemedicine application and verified that I am speaking with the correct person using two identifiers.  Location: Patient: Home Provider: Home Office   I discussed the limitations of evaluation and management by telemedicine and the availability of in person appointments. The patient expressed understanding and agreed to proceed.   I discussed the assessment and treatment plan with the patient. The patient was provided an opportunity to ask questions and all were answered. The patient agreed with the plan and demonstrated an understanding of the instructions.   The patient was advised to call back or seek an in-person evaluation if the symptoms worsen or if the condition fails to improve as anticipated.  40 minutes were spent in chart review, interview, psycho education, counseling, medical decision making, coordination of care and long-term prognosis.  Patient was given opportunity to ask question  and all concerns and questions were addressed and answers. Excluding separately billable services.   Arvella CHRISTELLA Finder, MD

## 2023-04-19 ENCOUNTER — Encounter (HOSPITAL_COMMUNITY): Payer: Self-pay | Admitting: Psychiatry

## 2023-04-19 ENCOUNTER — Telehealth (HOSPITAL_COMMUNITY): Payer: PRIVATE HEALTH INSURANCE | Admitting: Psychiatry

## 2023-04-19 DIAGNOSIS — F411 Generalized anxiety disorder: Secondary | ICD-10-CM

## 2023-04-19 DIAGNOSIS — F330 Major depressive disorder, recurrent, mild: Secondary | ICD-10-CM | POA: Diagnosis not present

## 2023-04-19 MED ORDER — DULOXETINE HCL 30 MG PO CPEP: 30.0000 mg | ORAL_CAPSULE | Freq: Every day | ORAL | 0 refills | Status: DC

## 2023-04-19 MED ORDER — TRAZODONE HCL 150 MG PO TABS: 150.0000 mg | ORAL_TABLET | Freq: Every day | ORAL | 1 refills | Status: DC

## 2023-04-19 MED ORDER — BUPROPION HCL ER (XL) 300 MG PO TB24: 300.0000 mg | ORAL_TABLET | ORAL | 0 refills | Status: DC

## 2023-04-22 ENCOUNTER — Telehealth (HOSPITAL_COMMUNITY): Payer: Self-pay | Admitting: Psychiatry

## 2023-05-01 ENCOUNTER — Other Ambulatory Visit (HOSPITAL_COMMUNITY): Payer: Self-pay | Admitting: Psychiatry

## 2023-05-01 DIAGNOSIS — F411 Generalized anxiety disorder: Secondary | ICD-10-CM

## 2023-05-01 DIAGNOSIS — F330 Major depressive disorder, recurrent, mild: Secondary | ICD-10-CM

## 2023-05-14 ENCOUNTER — Encounter: Payer: PRIVATE HEALTH INSURANCE | Admitting: Nurse Practitioner

## 2023-06-04 ENCOUNTER — Encounter: Payer: Self-pay | Admitting: Nurse Practitioner

## 2023-06-04 ENCOUNTER — Ambulatory Visit (INDEPENDENT_AMBULATORY_CARE_PROVIDER_SITE_OTHER): Payer: Self-pay | Admitting: Nurse Practitioner

## 2023-06-04 VITALS — BP 118/66 | HR 90 | Temp 98.0°F | Ht 68.0 in | Wt 142.2 lb

## 2023-06-04 DIAGNOSIS — K5904 Chronic idiopathic constipation: Secondary | ICD-10-CM

## 2023-06-04 DIAGNOSIS — Z8049 Family history of malignant neoplasm of other genital organs: Secondary | ICD-10-CM | POA: Insufficient documentation

## 2023-06-04 DIAGNOSIS — Z13220 Encounter for screening for lipoid disorders: Secondary | ICD-10-CM

## 2023-06-04 DIAGNOSIS — R42 Dizziness and giddiness: Secondary | ICD-10-CM | POA: Diagnosis not present

## 2023-06-04 DIAGNOSIS — Z0000 Encounter for general adult medical examination without abnormal findings: Secondary | ICD-10-CM | POA: Diagnosis not present

## 2023-06-04 DIAGNOSIS — Z800 Family history of malignant neoplasm of digestive organs: Secondary | ICD-10-CM | POA: Insufficient documentation

## 2023-06-04 DIAGNOSIS — Z136 Encounter for screening for cardiovascular disorders: Secondary | ICD-10-CM

## 2023-06-04 DIAGNOSIS — Z808 Family history of malignant neoplasm of other organs or systems: Secondary | ICD-10-CM | POA: Insufficient documentation

## 2023-06-04 LAB — LIPID PANEL
Cholesterol: 191 mg/dL (ref 0–200)
HDL: 43.6 mg/dL (ref >39.00)
LDL Cholesterol: 135 mg/dL — ABNORMAL HIGH (ref 0–99)
NonHDL: 147.41
Total CHOL/HDL Ratio: 4
Triglycerides: 61 mg/dL (ref 0.0–149.0)
VLDL: 12.2 mg/dL (ref 0.0–40.0)

## 2023-06-04 LAB — CBC
HCT: 39.1 % (ref 36.0–46.0)
Hemoglobin: 13.2 g/dL (ref 12.0–15.0)
MCHC: 33.8 g/dL (ref 30.0–36.0)
MCV: 92 fL (ref 78.0–100.0)
Platelets: 261 10*3/uL (ref 150.0–400.0)
RBC: 4.25 Mil/uL (ref 3.87–5.11)
RDW: 12.7 % (ref 11.5–15.5)
WBC: 5.2 10*3/uL (ref 4.0–10.5)

## 2023-06-04 LAB — IBC + FERRITIN
Ferritin: 120.9 ng/mL (ref 10.0–291.0)
Iron: 138 ug/dL (ref 42–145)
Saturation Ratios: 54.8 % — ABNORMAL HIGH (ref 20.0–50.0)
TIBC: 252 ug/dL (ref 250.0–450.0)
Transferrin: 180 mg/dL — ABNORMAL LOW (ref 212.0–360.0)

## 2023-06-04 LAB — COMPREHENSIVE METABOLIC PANEL
ALT: 9 U/L (ref 0–35)
AST: 12 U/L (ref 0–37)
Albumin: 4.6 g/dL (ref 3.5–5.2)
Alkaline Phosphatase: 32 U/L — ABNORMAL LOW (ref 39–117)
BUN: 13 mg/dL (ref 6–23)
CO2: 26 meq/L (ref 19–32)
Calcium: 9.2 mg/dL (ref 8.4–10.5)
Chloride: 105 meq/L (ref 96–112)
Creatinine, Ser: 0.76 mg/dL (ref 0.40–1.20)
GFR: 106.59 mL/min (ref >60.00)
Glucose, Bld: 82 mg/dL (ref 70–99)
Potassium: 4.1 meq/L (ref 3.5–5.1)
Sodium: 138 meq/L (ref 135–145)
Total Bilirubin: 0.9 mg/dL (ref 0.2–1.2)
Total Protein: 7.3 g/dL (ref 6.0–8.3)

## 2023-06-04 LAB — TSH: TSH: 0.92 u[IU]/mL (ref 0.35–5.50)

## 2023-06-04 MED ORDER — LINACLOTIDE 290 MCG PO CAPS: 290.0000 ug | ORAL_CAPSULE | Freq: Every day | ORAL | 3 refills | Status: AC

## 2023-06-04 NOTE — Progress Notes (Signed)
 Complete physical exam  Patient: Amanda Daugherty   DOB: October 01, 1995   27 y.o. Female  MRN: [id number] Visit Date: 06/04/2023  Subjective:    Chief Complaint  Patient presents with   Annual Exam   Amanda Daugherty is a 28 y.o. female who presents today for a complete physical exam. She reports consuming a general diet.  Walking daily  She generally feels fairly well. She reports sleeping fairly well. She does have additional problems to discuss today.  Vision:No Dental:Yes STD Screen:No  BP Readings from Last 3 Encounters:  06/04/23 118/66  05/14/22 114/80  05/10/22 102/78   She Reports Weight loss due to worsening depression since 01/2023. Associated with anorexia and nausea. She states symptoms are now resolved and appetite has improved. Wt Readings from Last 3 Encounters:  06/04/23 142 lb 3.2 oz (64.5 kg)  05/14/22 171 lb (77.6 kg)  05/10/22 168 lb 6.4 oz (76.4 kg)   Most recent fall risk assessment:    06/04/2023   10:06 AM  Fall Risk   Falls in the past year? 0  Number falls in past yr: 0  Injury with Fall? 0  Risk for fall due to : No Fall Risks  Follow up Falls prevention discussed;Falls evaluation completed   Depression screen:Yes - Depression Most recent depression screenings:    06/04/2023   10:06 AM 05/10/2022   10:46 AM  PHQ 2/9 Scores  PHQ - 2 Score 1 4  PHQ- 9 Score 11 15   HPI  Chronic idiopathic constipation Last colonoscopy 2022: normal BM 4x/week with use of linzess. Med refill sent  Past Medical History:  Diagnosis Date   Chronic idiopathic constipation    GI-- dr stark   Endometriosis    Family history of protein S deficiency    pt's mother   History of ITP    IBS (irritable bowel syndrome)    Migraines    Pelvic pain    Protein S deficiency [facility])    hematology/ oncology--- dr Candise Che   (06-06-2020  per pt has never had clot)   Protein S deficiency (HCC)    Past Surgical History:  Procedure Laterality Date   LAPAROSCOPIC  ENDOMETRIOSIS FULGURATION     LAPAROSCOPY N/A 06/09/2020   Procedure: LAPAROSCOPY DIAGNOSTIC, CAUTERY OF ENDOMETRIOSIS;  Surgeon: Richardean Chimera, MD;  Location: [facility];  Service: Gynecology;  Laterality: N/A;   TONSILLECTOMY  2017   Social History   Socioeconomic History   Marital status: Single    Spouse name: Not on file   Number of children: Not on file   Years of education: Not on file   Highest education level: Not on file  Occupational History   Not on file  Tobacco Use   Smoking status: Never    Passive exposure: Never   Smokeless tobacco: Never  Vaping Use   Vaping status: Never Used  Substance and Sexual Activity   Alcohol use: Not Currently    Comment: social   Drug use: Never   Sexual activity: Not on file  Other Topics Concern   Not on file  Social History Narrative   Not on file   Social Drivers of Health   Financial Resource Strain: Not on file  Food Insecurity: Not on file  Transportation Needs: Not on file  Physical Activity: Not on file  Stress: Not on file  Social Connections: Not on file  Intimate Partner Violence: Not on file   Family Status  Relation Name  Status   Mother  Alive   Father  Alive   MGM  (Not Specified)   MGF  (Not Specified)   PGF  (Not Specified)   Neg Hx  (Not Specified)  No partnership data on file   Family History  Problem Relation Age of Onset   Healthy Mother    Healthy Father    Hyperlipidemia Father    Colon cancer Maternal Grandmother    Uterine cancer Maternal Grandmother    Diabetes Maternal Grandfather    Heart disease Maternal Grandfather    Heart disease Paternal Grandfather    Melanoma Paternal Grandfather    Liver disease Neg Hx    Pancreatic cancer Neg Hx    Esophageal cancer Neg Hx    Stomach cancer Neg Hx    No Known Allergies  Patient Care Team: Yostin Malacara, Bonna Gains, NP as PCP - General (Internal Medicine)   Medications: Outpatient Medications Prior to Visit  Medication Sig    buPROPion (WELLBUTRIN XL) 300 MG 24 hr tablet Take 1 tablet (300 mg total) by mouth every morning.   clindamycin-benzoyl peroxide (BENZACLIN) gel Apply topically 2 (two) times daily.   clonazePAM (KLONOPIN) 0.5 MG tablet Take 1 tablet (0.5 mg total) by mouth daily as needed for anxiety. Pt states she takes monthly as needed   DULoxetine (CYMBALTA) 30 MG capsule Take 1 capsule (30 mg total) by mouth daily.   EPINEPHrine 0.3 mg/0.3 mL IJ SOAJ injection Inject 0.3 mg into the muscle as needed for anaphylaxis.   Ferrous Sulfate (IRON PO) Take by mouth.   levonorgestrel (KYLEENA) 19.5 MG IUD Kyleena 17.5 mcg/24 hrs (76yrs) 19.5mg  intrauterine device  Take by intrauterine route.   Multiple Vitamin (MULTIVITAMIN ADULT PO) Take by mouth daily.   OVER THE COUNTER MEDICATION    Probiotic Product (PROBIOTIC DAILY) CAPS Take by mouth daily.   rivaroxaban (XARELTO) 10 MG TABS tablet Start taking 10mg  po once daily 24hours before and continue for 48 hours after any long distance travel exceeding 4-6 hours for DVT prophylaxis in context of protein S deficiency.   rizatriptan (MAXALT) 10 MG tablet Take 1 tablet (10 mg total) by mouth as needed for migraine. May repeat in 2 hours if needed   spironolactone (ALDACTONE) 50 MG tablet Take 50 mg by mouth daily.   SUMAtriptan (IMITREX) 100 MG tablet Take 100 mg by mouth every 2 (two) hours as needed for migraine.   traZODone (DESYREL) 150 MG tablet Take 1 tablet (150 mg total) by mouth at bedtime.   [DISCONTINUED] linaclotide (LINZESS) 290 MCG CAPS capsule Take 1 capsule (290 mcg total) by mouth daily before breakfast.   No facility-administered medications prior to visit.    Review of Systems  Constitutional:  Negative for activity change, appetite change and unexpected weight change.  Respiratory: Negative.    Cardiovascular: Negative.   Gastrointestinal: Negative.   Endocrine: Negative for cold intolerance and heat intolerance.  Genitourinary: Negative.    Musculoskeletal: Negative.   Skin: Negative.   Neurological: Negative.   Hematological: Negative.   Psychiatric/Behavioral:  Negative for behavioral problems, decreased concentration, dysphoric mood, hallucinations, self-injury, sleep disturbance and suicidal ideas. The patient is not nervous/anxious.        Objective:  BP 118/66 (BP Location: Right Arm, Patient Position: Sitting, Cuff Size: Normal)   Pulse 90   Temp 98 F (36.7 C) (Temporal)   Ht 5\' 8"  (1.727 m)   Wt 142 lb 3.2 oz (64.5 kg)   SpO2 98%  BMI 21.62 kg/m     Physical Exam Vitals and nursing note reviewed.  Constitutional:      General: She is not in acute distress. HENT:     Right Ear: Tympanic membrane, ear canal and external ear normal.     Left Ear: Tympanic membrane, ear canal and external ear normal.     Nose: Nose normal.  Eyes:     Extraocular Movements: Extraocular movements intact.     Conjunctiva/sclera: Conjunctivae normal.     Pupils: Pupils are equal, round, and reactive to light.  Neck:     Thyroid: No thyroid mass, thyromegaly or thyroid tenderness.  Cardiovascular:     Rate and Rhythm: Normal rate and regular rhythm.     Pulses: Normal pulses.     Heart sounds: Normal heart sounds.  Pulmonary:     Effort: Pulmonary effort is normal.     Breath sounds: Normal breath sounds.  Abdominal:     General: Bowel sounds are normal.     Palpations: Abdomen is soft.  Musculoskeletal:        General: Normal range of motion.     Cervical back: Normal range of motion and neck supple.     Right lower leg: No edema.     Left lower leg: No edema.  Lymphadenopathy:     Cervical: No cervical adenopathy.  Skin:    General: Skin is warm and dry.  Neurological:     Mental Status: She is alert and oriented to person, place, and time.     Cranial Nerves: No cranial nerve deficit.  Psychiatric:        Mood and Affect: Mood normal.        Behavior: Behavior normal.        Thought Content: Thought  content normal.      Results for orders placed or performed in visit on 06/04/23  Comprehensive metabolic panel  Result Value Ref Range   Sodium 138 135 - 145 mEq/L   Potassium 4.1 3.5 - 5.1 mEq/L   Chloride 105 96 - 112 mEq/L   CO2 26 19 - 32 mEq/L   Glucose, Bld 82 70 - 99 mg/dL   BUN 13 6 - 23 mg/dL   Creatinine, Ser 1.61 0.40 - 1.20 mg/dL   Total Bilirubin 0.9 0.2 - 1.2 mg/dL   Alkaline Phosphatase 32 (L) 39 - 117 U/L   AST 12 0 - 37 U/L   ALT 9 0 - 35 U/L   Total Protein 7.3 6.0 - 8.3 g/dL   Albumin 4.6 3.5 - 5.2 g/dL   GFR 096.04 >54.09 mL/min   Calcium 9.2 8.4 - 10.5 mg/dL  TSH  Result Value Ref Range   TSH 0.92 0.35 - 5.50 uIU/mL  Lipid panel  Result Value Ref Range   Cholesterol 191 0 - 200 mg/dL   Triglycerides 81.1 0.0 - 149.0 mg/dL   HDL 91.47 >82.95 mg/dL   VLDL 62.1 0.0 - 30.8 mg/dL   LDL Cholesterol 657 (H) 0 - 99 mg/dL   Total CHOL/HDL Ratio 4    NonHDL 147.41   IBC + Ferritin  Result Value Ref Range   Iron 138 42 - 145 ug/dL   Transferrin 846.9 (L) 212.0 - 360.0 mg/dL   Saturation Ratios 62.9 (H) 20.0 - 50.0 %   Ferritin 120.9 10.0 - 291.0 ng/mL   TIBC 252.0 250.0 - 450.0 mcg/dL  CBC  Result Value Ref Range   WBC 5.2 4.0 - 10.5 K/uL  RBC 4.25 3.87 - 5.11 Mil/uL   Platelets 261.0 150.0 - 400.0 K/uL   Hemoglobin 13.2 12.0 - 15.0 g/dL   HCT 16.1 09.6 - 04.5 %   MCV 92.0 78.0 - 100.0 fl   MCHC 33.8 30.0 - 36.0 g/dL   RDW 40.9 81.1 - 91.4 %      Assessment & Plan:    Routine Health Maintenance and Physical Exam  Immunization History  Administered Date(s) Administered   Influenza,inj,Quad PF,6+ Mos 01/14/2019, 03/21/2020, 03/23/2021   Moderna Sars-Covid-2 Vaccination 04/21/2019, 05/20/2019   Tdap 03/23/2021   Unspecified SARS-COV-2 Vaccination 04/21/2019, 05/20/2019    Health Maintenance  Topic Date Due   Hepatitis C Screening  Never done   COVID-19 Vaccine (5 - 2024-25 season) 06/20/2023 (Originally 12/09/2022)   INFLUENZA VACCINE   07/08/2023 (Originally 11/08/2022)   Cervical Cancer Screening (Pap smear)  11/21/2025   DTaP/Tdap/Td (2 - Td or Tdap) 03/24/2031   HIV Screening  Completed   HPV VACCINES  Aged Out   Discussed health benefits of physical activity, and encouraged her to engage in regular exercise appropriate for her age and condition.  Problem List Items Addressed This Visit     Chronic idiopathic constipation   Last colonoscopy 2022: normal BM 4x/week with use of linzess. Med refill sent      Relevant Medications   linaclotide (LINZESS) 290 MCG CAPS capsule   Other Visit Diagnoses       Preventative health care    -  Primary   Relevant Orders   Comprehensive metabolic panel (Completed)   Lipid panel (Completed)     Encounter for lipid screening for cardiovascular disease       Relevant Orders   Lipid panel (Completed)     Dizziness       Relevant Orders   TSH (Completed)   IBC + Ferritin (Completed)   CBC (Completed)      Return in about 2 weeks (around 06/18/2023) for dizziness.     Alysia Penna, NP

## 2023-06-04 NOTE — Assessment & Plan Note (Signed)
 Last colonoscopy 2022: normal BM 4x/week with use of linzess. Med refill sent

## 2023-06-04 NOTE — Patient Instructions (Signed)
 Go to lab Maintain Heart healthy diet and daily exercise. Maintain current medications.

## 2023-06-05 ENCOUNTER — Encounter: Payer: Self-pay | Admitting: Nurse Practitioner

## 2023-06-10 NOTE — Progress Notes (Unsigned)
 BH MD/PA/NP OP Progress Note  06/11/2023 9:31 AM JODINE MUCHMORE  MRN:  [id number]  Visit Diagnosis:    ICD-10-CM   1. GAD (generalized anxiety disorder)  F41.1 hydrOXYzine (ATARAX) 25 MG tablet    DULoxetine (CYMBALTA) 30 MG capsule    buPROPion (WELLBUTRIN XL) 300 MG 24 hr tablet    2. Mild episode of recurrent major depressive disorder (HCC)  F33.0 DULoxetine (CYMBALTA) 30 MG capsule    buPROPion (WELLBUTRIN XL) 300 MG 24 hr tablet       Assessment: RILEI KRAVITZ is a 28 y.o. female with a history of GAD who presented to [facility] for initial evaluation on 06/28/2022.  At initial evaluation patient reported symptoms of anxiety including racing thoughts, constant worry that she is unable to control, difficulty relaxing, restlessness, increased irritability, and feelings of imposter syndrome that is affecting her day to day. She has experienced panic attacks in the past. Patient also endorsed neurovegetative symptoms of depression including low mood, anhedonia, sleep disturbances, decreased appetite, difficulty concentrating, and fatigue. She denied any SI/HI or thoughts of self harm. Psychosocially patient is dealing with multiple stressors including her fiance living in another country and increased stress after starting a new job. She met criteria for GAD and MDD with further neuropsych testing being needed to confirm ADHD.  Brayton Layman presents for follow-up evaluation. Today, 06/11/23, patient reports continued improvement over the past month and a half.  She does still have a number of psychosocial stressors including planning a wedding, looking for a house, ongoing job promotion, and financial stressors.  However she has been able to manage this fairly well and her partner has arrived in the [place] which has been a huge support.  Patient has tolerated the titration of Wellbutrin well and denies any adverse side effects.  She feels like mood is  stable and well controlled during the day in addition to libido improving.  Sleep is the only ongoing issue and trazodone had been over sedating at the 75 and 150 mg dose.  We will discontinue that and start hydroxyzine 50 mg at bedtime with an additional 25 mg to be taken as needed.  Risk and benefits of this medicine were reviewed.  Patient will follow up in 3 months.  She will also be referred for therapy and pursue neuropsych testing now that her insurance has updated.  Plan: - Continue Cymbalta 30 mg QD - Continue Wellbutrin 300 mg XL daily - Start Atarax 50 mg at bedtime and 25 mg prn - Discontinue Trazodone 75-150 mg QHS - Continue Klonopin 0.5 mg QD prn for anxiety - Recommended light therapy - Therapy referral - Neuropsych testing referral - Crisis resources reviewed - Follow up in 3 months  Chief Complaint:  Chief Complaint  Patient presents with   Follow-up   HPI: Shari presents reporting that things continue to go well. Her partners visa has gotten approved and he arrived last week. They will get legally married in the next week and are planning the big formal wedding in May.  On top of the stress of planning a wedding she is also in the process of getting a promotion at work and looking for a house for the 2 of them to move to.  This has left her feeling anxious though not to an unmanageable degree.  Overall she feels much better compared to the past and is further supported by her partner who arrived in the states last week.  He has to stay until his green card arrives which has put a bit more of the financial burden on [place] during this time.  Despite that she is happy with the way that everything is working out.  She has tolerated the increase of Wellbutrin well and reports that she feels really good during the days.  She can still periods of motions but does not have the drastic shifts 1 way or the other.  Furthermore the libido concerns have improved with the titration of  Wellbutrin.  Sleep does continue to be an issue.  Patient tried to increase trazodone with benefit for sleep however felt groggy the following day.  This still occurred with the 75 mg dose.  She did end up taking Klonopin a couple times in the interim to help with sleep with good effect.  Patient is aware that this is not a good long-term option.  We reviewed some alternatives today including hydroxyzine.  Patient had been on this in the past though for an itch related to a rash.  She cannot recall any adverse effects or if it made her sedated.  Her sleep issues at this point are more related to anxiety symptoms.  Patient can have difficulty both with multiple nighttime awakenings or falling asleep at night.  At each time she has numerous thoughts running through her head of things that she needs to do.  With that said it would be appropriate to start hydroxyzine as needed at bedtime with an additional 25 mg dose to be taken in the middle of the night if she were to wake up.  Risk and benefits of this were reviewed.  Patient also mentioned that her insurance has been updated so she was open to therapy referral today.  She also plans to reach out to continue with the neuropsych testing process.  Past Psychiatric History: She denies any prior psychiatric hospitalizations or suicide attempts  Has tried Atarax for itching in the past, does not recall adverse side effects. Currently on Cymbalta and Klonopin.  Denies any substance use other than one cup of caffeine a day or occasional social use.   Past Medical History:  Past Medical History:  Diagnosis Date   Chronic idiopathic constipation    GI-- dr stark   Endometriosis    Family history of protein S deficiency    pt's mother   History of ITP    IBS (irritable bowel syndrome)    Migraines    Pelvic pain    Protein S deficiency [facility])    hematology/ oncology--- dr Candise Che   (06-06-2020  per pt has never had clot)   Protein S deficiency (HCC)      Past Surgical History:  Procedure Laterality Date   LAPAROSCOPIC ENDOMETRIOSIS FULGURATION     LAPAROSCOPY N/A 06/09/2020   Procedure: LAPAROSCOPY DIAGNOSTIC, CAUTERY OF ENDOMETRIOSIS;  Surgeon: Richardean Chimera, MD;  Location: [facility];  Service: Gynecology;  Laterality: N/A;   TONSILLECTOMY  2017   Family History:  Family History  Problem Relation Age of Onset   Healthy Mother    Healthy Father    Hyperlipidemia Father    Colon cancer Maternal Grandmother    Uterine cancer Maternal Grandmother    Diabetes Maternal Grandfather    Heart disease Maternal Grandfather    Heart disease Paternal Grandfather    Melanoma Paternal Grandfather    Liver disease Neg Hx    Pancreatic cancer Neg Hx    Esophageal cancer Neg Hx  Stomach cancer Neg Hx     Social History:  Social History   Socioeconomic History   Marital status: Single    Spouse name: Not on file   Number of children: Not on file   Years of education: Not on file   Highest education level: Not on file  Occupational History   Not on file  Tobacco Use   Smoking status: Never    Passive exposure: Never   Smokeless tobacco: Never  Vaping Use   Vaping status: Never Used  Substance and Sexual Activity   Alcohol use: Not Currently    Comment: social   Drug use: Never   Sexual activity: Not on file  Other Topics Concern   Not on file  Social History Narrative   Not on file   Social Drivers of Health   Financial Resource Strain: Not on file  Food Insecurity: Not on file  Transportation Needs: Not on file  Physical Activity: Not on file  Stress: Not on file  Social Connections: Not on file    Allergies: No Known Allergies  Current Medications: Current Outpatient Medications  Medication Sig Dispense Refill   hydrOXYzine (ATARAX) 25 MG tablet Take 2 tablets (50 mg total) by mouth every evening. May also take 1 tablet (25 mg total) at bedtime as needed. 90 tablet 2   buPROPion (WELLBUTRIN XL)  300 MG 24 hr tablet Take 1 tablet (300 mg total) by mouth every morning. 90 tablet 0   clindamycin-benzoyl peroxide (BENZACLIN) gel Apply topically 2 (two) times daily. 25 g 1   clonazePAM (KLONOPIN) 0.5 MG tablet Take 1 tablet (0.5 mg total) by mouth daily as needed for anxiety. Pt states she takes monthly as needed 30 tablet 0   DULoxetine (CYMBALTA) 30 MG capsule Take 1 capsule (30 mg total) by mouth daily. 90 capsule 0   EPINEPHrine 0.3 mg/0.3 mL IJ SOAJ injection Inject 0.3 mg into the muscle as needed for anaphylaxis. 1 each 1   Ferrous Sulfate (IRON PO) Take by mouth.     levonorgestrel (KYLEENA) 19.5 MG IUD Kyleena 17.5 mcg/24 hrs (38yrs) 19.5mg  intrauterine device  Take by intrauterine route.     linaclotide (LINZESS) 290 MCG CAPS capsule Take 1 capsule (290 mcg total) by mouth daily before breakfast. 90 capsule 3   Multiple Vitamin (MULTIVITAMIN ADULT PO) Take by mouth daily.     OVER THE COUNTER MEDICATION      Probiotic Product (PROBIOTIC DAILY) CAPS Take by mouth daily.     rivaroxaban (XARELTO) 10 MG TABS tablet Start taking 10mg  po once daily 24hours before and continue for 48 hours after any long distance travel exceeding 4-6 hours for DVT prophylaxis in context of protein S deficiency. 30 tablet 0   rizatriptan (MAXALT) 10 MG tablet Take 1 tablet (10 mg total) by mouth as needed for migraine. May repeat in 2 hours if needed 10 tablet 10   spironolactone (ALDACTONE) 50 MG tablet Take 50 mg by mouth daily.     SUMAtriptan (IMITREX) 100 MG tablet Take 100 mg by mouth every 2 (two) hours as needed for migraine.     No current facility-administered medications for this visit.     Psychiatric Specialty Exam: Review of Systems  There were no vitals taken for this visit.There is no height or weight on file to calculate BMI.  General Appearance: Fairly Groomed  Eye Contact:  Fair  Speech:  Clear and Coherent  Volume:  Normal  Mood:  Anxious and Euthymic  Affect:  Appropriate and  Congruent  Thought Process:  Coherent and Goal Directed  Orientation:  Full (Time, Place, and Person)  Thought Content: Logical   Suicidal Thoughts:  No  Homicidal Thoughts:  No  Memory:  Immediate;   Good  Judgement:  Good  Insight:  Fair  Psychomotor Activity:  Decreased  Concentration:  Concentration: Fair  Recall:  Fair  Fund of Knowledge: Fair  Language: Good  Akathisia:  NA    AIMS (if indicated): not done  Assets:  Communication Skills Desire for Improvement Housing Vocational/Educational  ADL's:  Intact  Cognition: WNL  Sleep:  Fair   Metabolic Disorder Labs: No results found for: "HGBA1C", "MPG" No results found for: "PROLACTIN" Lab Results  Component Value Date   CHOL 191 06/04/2023   TRIG 61.0 06/04/2023   HDL 43.60 06/04/2023   CHOLHDL 4 06/04/2023   VLDL 12.2 06/04/2023   LDLCALC 135 (H) 06/04/2023   LDLCALC 123 (H) 03/22/2020   Lab Results  Component Value Date   TSH 0.92 06/04/2023   TSH 0.68 05/14/2022    Therapeutic Level Labs: No results found for: "LITHIUM" No results found for: "VALPROATE" No results found for: "CBMZ"   Screenings: GAD-7    Flowsheet Row Office Visit from 06/04/2023 in [facility] [employer] [facility] Visit from 03/23/2021 in [facility] [employer] [facility] Visit from 03/21/2020 in [facility] [employer]  Total GAD-7 Score 12 13 7       PHQ2-9    Flowsheet Row Office Visit from 06/04/2023 in [facility] Visit from 05/10/2022 in [facility] [employer] [facility] Visit from 03/23/2021 in [facility] [employer] Visit from 03/21/2020 in [facility] [employer]  PHQ-2 Total Score 1 4 2 2   PHQ-9 Total Score 11 15 12 5       Flowsheet Row Admission (Discharged) from 06/09/2020 in [facility]  C-SSRS RISK CATEGORY No  Risk (P)        Collaboration of Care: Collaboration of Care: Medication Management AEB medication prescription and Other provider involved in patient's care AEB PCP chart review  Patient/Guardian was advised Release of Information must be obtained prior to any record release in order to collaborate their care with an outside provider. Patient/Guardian was advised if they have not already done so to contact the registration department to sign all necessary forms in order for [place] to release information regarding their care.   Consent: Patient/Guardian gives verbal consent for treatment and assignment of benefits for services provided during this visit. Patient/Guardian expressed understanding and agreed to proceed.    Stasia Cavalier, MD 06/11/2023, 9:31 AM   Virtual Visit via Video Note  I connected with Corena Herter on 06/11/23 at  8:30 AM EST by a video enabled telemedicine application and verified that I am speaking with the correct person using two identifiers.  Location: Patient: Home Provider: Home Office   I discussed the limitations of evaluation and management by telemedicine and the availability of in person appointments. The patient expressed understanding and agreed to proceed.   I discussed the assessment and treatment plan with the patient. The patient was provided an opportunity to ask questions and all were answered. The patient agreed with the plan and demonstrated an understanding of the instructions.   The patient was advised to call back or seek an in-person evaluation if the  symptoms worsen or if the condition fails to improve as anticipated.  40 minutes were spent in chart review, interview, psycho education, counseling, medical decision making, coordination of care and long-term prognosis.  Patient was given opportunity to ask question and all concerns and questions were addressed and answers. Excluding separately billable services.   Stasia Cavalier, MD

## 2023-06-11 ENCOUNTER — Encounter (HOSPITAL_COMMUNITY): Payer: Self-pay | Admitting: Psychiatry

## 2023-06-11 ENCOUNTER — Telehealth (HOSPITAL_COMMUNITY): Payer: 59 | Admitting: Psychiatry

## 2023-06-11 DIAGNOSIS — F330 Major depressive disorder, recurrent, mild: Secondary | ICD-10-CM | POA: Diagnosis not present

## 2023-06-11 DIAGNOSIS — F411 Generalized anxiety disorder: Secondary | ICD-10-CM | POA: Diagnosis not present

## 2023-06-11 MED ORDER — HYDROXYZINE HCL 25 MG PO TABS: ORAL_TABLET | ORAL | 2 refills | Status: DC

## 2023-06-11 MED ORDER — BUPROPION HCL ER (XL) 300 MG PO TB24: 300.0000 mg | ORAL_TABLET | ORAL | 0 refills | Status: DC

## 2023-06-11 MED ORDER — DULOXETINE HCL 30 MG PO CPEP: 30.0000 mg | ORAL_CAPSULE | Freq: Every day | ORAL | 0 refills | Status: DC

## 2023-06-18 ENCOUNTER — Encounter: Payer: Self-pay | Admitting: Nurse Practitioner

## 2023-06-18 ENCOUNTER — Ambulatory Visit: Payer: PRIVATE HEALTH INSURANCE | Admitting: Nurse Practitioner

## 2023-06-18 VITALS — Temp 98.4°F | Ht 68.0 in | Wt 146.2 lb

## 2023-06-18 DIAGNOSIS — R42 Dizziness and giddiness: Secondary | ICD-10-CM | POA: Insufficient documentation

## 2023-06-18 DIAGNOSIS — R55 Syncope and collapse: Secondary | ICD-10-CM

## 2023-06-18 NOTE — Patient Instructions (Addendum)
 Hold spironolactone Avoid activities which trigger symptoms Do not limit salt intake in diet. Change positions slowly Normal ECG  Postural Orthostatic Tachycardia Syndrome Postural orthostatic tachycardia syndrome (POTS) is a group of symptoms that occur along with an increase in heart rate when a person stands up after lying down. The symptoms include light-headedness or fainting, and they improve when the person lies back down. POTS may be associated with another medical condition, or it may occur on its own. What are the causes? The cause of this condition is not known, but many conditions and diseases are associated with it. What increases the risk? This condition is more likely to develop in: Women 57-12 years old. Women who are pregnant. Women who are in their period (menstruating). People who have certain conditions, such as: Infection from a virus. Diseases that cause the body's defense system (immune system) to attack healthy organs. These are called autoimmune diseases. Losing a lot of red blood cells (anemia). Losing too much water in the body (dehydration). An overactive thyroid (hyperthyroidism). People who take certain medicines. People who have had a major injury. People who have had surgery. What are the signs or symptoms? The most common symptom of this condition is light-headedness when you stand up from a lying or sitting position. Other symptoms may include: Feeling a rapid increase in the heartbeat (tachycardia) within 10 minutes of standing up. Chest pain. Shortness of breath. Breathing that is deeper and faster than normal (hyperventilation). Fainting. Confusion. Trembling. Weakness. Headache. Anxiety. Nausea. Sweating or flushing. Symptoms may be worse in the morning, and they may be relieved by lying down. How is this diagnosed? This condition is diagnosed based on: Your symptoms. Your medical history. A physical exam. Checking your heart rate  when you are lying down and after you stand up. Checking your blood pressure when you go from lying down to standing up. Blood and urine tests to measure hormones that change with blood pressure. The blood tests will be done when you are lying down and when you are standing up. You may have other tests to check for conditions or diseases that are associated with POTS. How is this treated? Treatment for this condition depends on how severe your symptoms are and whether you have any conditions or diseases that are associated with POTS. Treatment may involve: Treating any conditions or diseases that are associated with POTS. Drinking two glasses of water before getting up from a lying position. Increasing salt (sodium) in your diet. Taking medicine to control blood pressure and heart rate (beta-blocker). Avoiding certain medicines. Starting an exercise program under the supervision of a health care provider. Follow these instructions at home: Medicines Take over-the-counter and prescription medicines only as told by your health care provider. Let your health care provider know about all prescription or over-the-counter medicines you take. These include herbs, vitamins, and supplements. You may need to stop or adjust some medicines if they cause this condition. Talk with your health care provider before starting any new medicines. Eating and drinking  Drink enough fluid to keep your urine pale yellow. If told by your health care provider, drink two glasses of water before getting up from a lying position. Follow instructions from your health care provider about how much sodium you should include in your diet. Avoid heavy meals. Eat several small meals a day instead of a few large meals. General instructions Do an aerobic exercise for 20 minutes a day, at least 3 days a week. Aerobic exercises are  those that cause your heart to beat faster. Ask your health care provider what kinds of exercise are  safe for you. Do not use any products that contain nicotine or tobacco. These products include cigarettes, chewing tobacco, and vaping devices, such as e-cigarettes. These can interfere with blood flow. If you need help quitting, ask your health care provider. Keep all follow-up visits. This is important. Contact a health care provider if: Your symptoms do not improve after treatment. Your symptoms get worse. You develop new symptoms. Get help right away if: You have chest pain. You have difficulty breathing. You have fainting episodes. These symptoms may be an emergency. Get help right away. Call 911. Do not wait to see if the symptoms will go away. Do not drive yourself to the hospital. Summary POTS is a group of symptoms that occur along with an increase in heart rate when a person stands up after lying down. The most common symptom is light-headedness when you stand up. Treatment for this condition includes treating any underlying conditions, drinking plenty of water, stopping or changing some medicines, or starting an exercise program. Get help right away if you have chest pain, difficulty breathing, or fainting episodes. These symptoms may be an emergency. This information is not intended to replace advice given to you by your health care provider. Make sure you discuss any questions you have with your health care provider. Document Revised: 10/06/2020 Document Reviewed: 10/06/2020 Elsevier Patient Education  2024 [employer].

## 2023-06-18 NOTE — Progress Notes (Signed)
 Established Patient Visit  Patient: Amanda Daug[facility]   DOB: 1996/02/25   27 y.o. Female  MRN: [id number] Visit Date: 06/18/2023  Subjective:    Chief Complaint  Patient presents with   Follow-up    Follow up for dizziness still having dizziness when changing positions and performing certain working routines    HPI Postural dizziness with near syncope Onset 1year ago, lightheadedness, with squatting, arms over her head, moving quickly from sitting/laying to standing position. She drinks about 72oz of fluid daily. Hx of syncope in her early 24s, ived in [place], [place] at the time, had eval by cardiology but no abnormal finding per patient.  Possible POTs vs orthostatic hypotension Positive orthostatic hypotension in office ECG completed today: normal. Nor previous ECG to compare Normal CBC, CMP, THYROID Entered cardiology referral. Advised to Hold spironolactone, Avoid activities which trigger symptoms, Do not limit salt intake in diet, and Change positions slowly  Reviewed medical, surgical, and social history today  Medications: Outpatient Medications Prior to Visit  Medication Sig   buPROPion (WELLBUTRIN XL) 300 MG 24 hr tablet Take 1 tablet (300 mg total) by mouth every morning.   clindamycin-benzoyl peroxide (BENZACLIN) gel Apply topically 2 (two) times daily.   clonazePAM (KLONOPIN) 0.5 MG tablet Take 1 tablet (0.5 mg total) by mouth daily as needed for anxiety. Pt states she takes monthly as needed   DULoxetine (CYMBALTA) 30 MG capsule Take 1 capsule (30 mg total) by mouth daily.   EPINEPHrine 0.3 mg/0.3 mL IJ SOAJ injection Inject 0.3 mg into the muscle as needed for anaphylaxis.   Ferrous Sulfate (IRON PO) Take by mouth.   hydrOXYzine (ATARAX) 25 MG tablet Take 2 tablets (50 mg total) by mouth every evening. May also take 1 tablet (25 mg total) at bedtime as needed.   levonorgestrel (KYLEENA) 19.5 MG IUD Kyleena 17.5 mcg/24 hrs (43yrs) 19.5mg  intrauterine  device  Take by intrauterine route.   linaclotide (LINZESS) 290 MCG CAPS capsule Take 1 capsule (290 mcg total) by mouth daily before breakfast.   Multiple Vitamin (MULTIVITAMIN ADULT PO) Take by mouth daily.   OVER THE COUNTER MEDICATION    Probiotic Product (PROBIOTIC DAILY) CAPS Take by mouth daily.   rivaroxaban (XARELTO) 10 MG TABS tablet Start taking 10mg  po once daily 24hours before and continue for 48 hours after any long distance travel exceeding 4-6 hours for DVT prophylaxis in context of protein S deficiency.   rizatriptan (MAXALT) 10 MG tablet Take 1 tablet (10 mg total) by mouth as needed for migraine. May repeat in 2 hours if needed   spironolactone (ALDACTONE) 50 MG tablet Take 50 mg by mouth daily.   SUMAtriptan (IMITREX) 100 MG tablet Take 100 mg by mouth every 2 (two) hours as needed for migraine.   No facility-administered medications prior to visit.   Reviewed past medical and social history.   ROS per HPI above  Last CBC Lab Results  Component Value Date   WBC 5.2 06/04/2023   HGB 13.2 06/04/2023   HCT 39.1 06/04/2023   MCV 92.0 06/04/2023   MCH 30.8 05/18/2021   RDW 12.7 06/04/2023   PLT 261.0 06/04/2023   Last metabolic panel Lab Results  Component Value Date   GLUCOSE 82 06/04/2023   NA 138 06/04/2023   K 4.1 06/04/2023   CL 105 06/04/2023   CO2 26 06/04/2023   BUN 13 06/04/2023   CREATININE 0.76  06/04/2023   GFR 106.59 06/04/2023   CALCIUM 9.2 06/04/2023   PROT 7.3 06/04/2023   ALBUMIN 4.6 06/04/2023   BILITOT 0.9 06/04/2023   ALKPHOS 32 (L) 06/04/2023   AST 12 06/04/2023   ALT 9 06/04/2023   ANIONGAP 7 05/18/2021   Last thyroid functions Lab Results  Component Value Date   TSH 0.92 06/04/2023      Objective:  Temp 98.4 F (36.9 C) (Temporal)   Ht 5\' 8"  (1.727 m)   Wt 146 lb 3.2 oz (66.3 kg)   SpO2 99%   BMI 22.23 kg/m      Physical Exam Vitals and nursing note reviewed.  Cardiovascular:     Rate and Rhythm: Normal rate and  regular rhythm.     Pulses: Normal pulses.          Carotid pulses are 2+ on the right side and 2+ on the left side.    Heart sounds: Normal heart sounds.  Pulmonary:     Effort: Pulmonary effort is normal.     Breath sounds: Normal breath sounds.  Musculoskeletal:     Right lower leg: No edema.     Left lower leg: No edema.  Neurological:     Mental Status: She is alert and oriented to person, place, and time.  Psychiatric:        Mood and Affect: Mood normal.        Behavior: Behavior normal.        Thought Content: Thought content normal.     No results found for any visits on 06/18/23.    Assessment & Plan:    Problem List Items Addressed This Visit     Postural dizziness with near syncope - Primary   Onset 1year ago, lightheadedness, with squatting, arms over her head, moving quickly from sitting/laying to standing position. She drinks about 72oz of fluid daily. Hx of syncope in her early 69s, ived in [place], [place] at the time, had eval by cardiology but no abnormal finding per patient.  Possible POTs vs orthostatic hypotension Positive orthostatic hypotension in office ECG completed today: normal. Nor previous ECG to compare Normal CBC, CMP, THYROID Entered cardiology referral. Advised to Hold spironolactone, Avoid activities which trigger symptoms, Do not limit salt intake in diet, and Change positions slowly      Relevant Orders   Ambulatory referral to Cardiology   EKG 12-Lead (Completed)   Return if symptoms worsen or fail to improve.     Alysia Penna, NP

## 2023-06-18 NOTE — Assessment & Plan Note (Addendum)
 Onset 1year ago, lightheadedness, with squatting, arms over her head, moving quickly from sitting/laying to standing position. She drinks about 72oz of fluid daily. Hx of syncope in her early 56s, ived in [place], [place] at the time, had eval by cardiology but no abnormal finding per patient.  Possible POTs vs orthostatic hypotension Positive orthostatic hypotension in office ECG completed today: normal. Nor previous ECG to compare Normal CBC, CMP, THYROID Entered cardiology referral. Advised to Hold spironolactone, Avoid activities which trigger symptoms, Do not limit salt intake in diet, and Change positions slowly

## 2023-07-20 ENCOUNTER — Other Ambulatory Visit (HOSPITAL_COMMUNITY): Payer: Self-pay | Admitting: Psychiatry

## 2023-07-20 DIAGNOSIS — F330 Major depressive disorder, recurrent, mild: Secondary | ICD-10-CM

## 2023-07-20 DIAGNOSIS — F411 Generalized anxiety disorder: Secondary | ICD-10-CM

## 2023-09-16 NOTE — Progress Notes (Deleted)
 BH MD/PA/NP OP Progress Note  09/16/2023 8:54 AM Amanda Daugherty  MRN:  [id number]  Visit Diagnosis:    ICD-10-CM   1. GAD (generalized anxiety disorder)  F41.1     2. Mild episode of recurrent major depressive disorder [facility])  F33.0       Assessment: Amanda Daugherty is a 28 y.o. female with a history of GAD who presented to [facility] for initial evaluation on 06/28/2022.  At initial evaluation patient reported symptoms of anxiety including racing thoughts, constant worry that she is unable to control, difficulty relaxing, restlessness, increased irritability, and feelings of imposter syndrome that is affecting her day to day. She has experienced panic attacks in the past. Patient also endorsed neurovegetative symptoms of depression including low mood, anhedonia, sleep disturbances, decreased appetite, difficulty concentrating, and fatigue. She denied any SI/HI or thoughts of self harm. Psychosocially patient is dealing with multiple stressors including her fiance living in another country and increased stress after starting a new job. She met criteria for GAD and MDD with further neuropsych testing being needed to confirm ADHD.  Amanda Daugherty presents for follow-up evaluation. Today, 09/16/23, patient reports    continued improvement over the past month and a half.  She does still have a number of psychosocial stressors including planning a wedding, looking for a house, ongoing job promotion, and financial stressors.  However she has been able to manage this fairly well and her partner has arrived in the [place]  which has been a huge support.  Patient has tolerated the titration of Wellbutrin  well and denies any adverse side effects.  She feels like mood is stable and well controlled during the day in addition to libido improving.  Sleep is the only ongoing issue and trazodone  had been over sedating at the 75 and 150 mg dose.  We will discontinue that and  start hydroxyzine  50 mg at bedtime with an additional 25 mg to be taken as needed.  Risk and benefits of this medicine were reviewed.  Patient will follow up in 3 months.  She will also be referred for therapy and pursue neuropsych testing now that her insurance has updated.  Plan: - Continue Cymbalta  30 mg QD - Continue Wellbutrin  300 mg XL daily - Start Atarax  50 mg at bedtime and 25 mg prn - Discontinue Trazodone  75-150 mg QHS - Continue Klonopin  0.5 mg QD prn for anxiety - Recommended light therapy - Therapy referral - Neuropsych testing referral - Crisis resources reviewed - Follow up in 3 months  Chief Complaint:  No chief complaint on file.  HPI: Amanda Daugherty presents reporting that    things continue to go well. Her partners visa has gotten approved and he arrived last week. They will get legally married in the next week and are planning the big formal wedding in May.  On top of the stress of planning a wedding she is also in the process of getting a promotion at work and looking for a house for the 2 of them to move to.  This has left her feeling anxious though not to an unmanageable degree.  Overall she feels much better compared to the past and is further supported by her partner who arrived in the [place] last week.  He has to stay until his green card arrives which has put a bit more of the financial burden on Amanda Daugherty during this time.  Despite that she is happy with the way that everything is working  out.  She has tolerated the increase of Wellbutrin  well and reports that she feels really good during the days.  She can still periods of motions but does not have the drastic shifts 1 way or the other.  Furthermore the libido concerns have improved with the titration of Wellbutrin .  Sleep does continue to be an issue.  Patient tried to increase trazodone  with benefit for sleep however felt groggy the following day.  This still occurred with the 75 mg dose.  She did end up taking  Klonopin  a couple times in the interim to help with sleep with good effect.  Patient is aware that this is not a good long-term option.  We reviewed some alternatives today including hydroxyzine .  Patient had been on this in the past though for an itch related to a rash.  She cannot recall any adverse effects or if it made her sedated.  Her sleep issues at this point are more related to anxiety symptoms.  Patient can have difficulty both with multiple nighttime awakenings or falling asleep at night.  At each time she has numerous thoughts running through her head of things that she needs to do.  With that said it would be appropriate to start hydroxyzine  as needed at bedtime with an additional 25 mg dose to be taken in the middle of the night if she were to wake up.  Risk and benefits of this were reviewed.  Patient also mentioned that her insurance has been updated so she was open to therapy referral today.  She also plans to reach out to continue with the neuropsych testing process.  Past Psychiatric History: She denies any prior psychiatric hospitalizations or suicide attempts  Has tried Atarax  for itching in the past, does not recall adverse side effects. Currently on Cymbalta  and Klonopin .  Denies any substance use other than one cup of caffeine a day or occasional social use.   Past Medical History:  Past Medical History:  Diagnosis Date   Chronic idiopathic constipation    GI-- dr stark   Endometriosis    Family history of protein S deficiency    pt's mother   History of ITP    IBS (irritable bowel syndrome)    Migraines    Pelvic pain    Protein S deficiency [facility])    hematology/ oncology--- dr Salomon Cree   (06-06-2020  per pt has never had clot)   Protein S deficiency (HCC)     Past Surgical History:  Procedure Laterality Date   LAPAROSCOPIC ENDOMETRIOSIS FULGURATION     LAPAROSCOPY N/A 06/09/2020   Procedure: LAPAROSCOPY DIAGNOSTIC, CAUTERY OF ENDOMETRIOSIS;  Surgeon: Merryl Abraham,  MD;  [facility]: [facility];  Service: Gynecology;  Laterality: N/A;   TONSILLECTOMY  2017   Family History:  Family History  Problem Relation Age of Onset   Healthy Mother    Clotting disorder Mother    Healthy Father    Hyperlipidemia Father    Colon cancer Maternal Grandmother    Uterine cancer Maternal Grandmother    Diabetes Maternal Grandfather    Heart disease Maternal Grandfather    Heart disease Paternal Grandfather    Melanoma Paternal Grandfather    Liver disease Neg Hx    Pancreatic cancer Neg Hx    Esophageal cancer Neg Hx    Stomach cancer Neg Hx     Social History:  Social History   Socioeconomic History   Marital status: Single    Spouse name: Not on file  Number of children: Not on file   Years of education: Not on file   Highest education level: Not on file  Occupational History   Not on file  Tobacco Use   Smoking status: Never    Passive exposure: Never   Smokeless tobacco: Never  Vaping Use   Vaping status: Never Used  Substance and Sexual Activity   Alcohol use: Not Currently    Comment: social   Drug use: Never   Sexual activity: Not on file  Other Topics Concern   Not on file  Social History Narrative   Not on file   Social Drivers of Health   Financial Resource Strain: Not on file  Food Insecurity: Not on file  Transportation Needs: Not on file  Physical Activity: Not on file  Stress: Not on file  Social Connections: Not on file    Allergies: No Known Allergies  Current Medications: Current Outpatient Medications  Medication Sig Dispense Refill   buPROPion  (WELLBUTRIN  XL) 300 MG 24 hr tablet TAKE 1 TABLET BY MOUTH EVERY MORNING 90 tablet 0   clindamycin -benzoyl peroxide (BENZACLIN) gel Apply topically 2 (two) times daily. 25 g 1   clonazePAM  (KLONOPIN ) 0.5 MG tablet Take 1 tablet (0.5 mg total) by mouth daily as needed for anxiety. Pt states she takes monthly as needed 30 tablet 0   DULoxetine  (CYMBALTA ) 30 MG  capsule TAKE 1 CAPSULE BY MOUTH DAILY 90 capsule 0   EPINEPHrine  0.3 mg/0.3 mL IJ SOAJ injection Inject 0.3 mg into the muscle as needed for anaphylaxis. 1 each 1   Ferrous Sulfate (IRON PO) Take by mouth.     hydrOXYzine  (ATARAX ) 25 MG tablet Take 2 tablets (50 mg total) by mouth every evening. May also take 1 tablet (25 mg total) at bedtime as needed. 90 tablet 2   levonorgestrel (KYLEENA) 19.5 MG IUD Kyleena 17.5 mcg/24 hrs (35yrs) 19.5mg  intrauterine device  Take by intrauterine route.     linaclotide  (LINZESS ) 290 MCG CAPS capsule Take 1 capsule (290 mcg total) by mouth daily before breakfast. 90 capsule 3   Multiple Vitamin (MULTIVITAMIN ADULT PO) Take by mouth daily.     OVER THE COUNTER MEDICATION      Probiotic Product (PROBIOTIC DAILY) CAPS Take by mouth daily.     rivaroxaban  (XARELTO ) 10 MG TABS tablet Start taking 10mg  po once daily 24hours before and continue for 48 hours after any long distance travel exceeding 4-6 hours for DVT prophylaxis in context of protein S deficiency. 30 tablet 0   rizatriptan  (MAXALT ) 10 MG tablet Take 1 tablet (10 mg total) by mouth as needed for migraine. May repeat in 2 hours if needed 10 tablet 10   spironolactone (ALDACTONE) 50 MG tablet Take 50 mg by mouth daily.     SUMAtriptan  (IMITREX ) 100 MG tablet Take 100 mg by mouth every 2 (two) hours as needed for migraine.     No current facility-administered medications for this visit.     Psychiatric Specialty Exam: Review of Systems  There were no vitals taken for this visit.There is no height or weight on file to calculate BMI.  General Appearance: Fairly Groomed  Eye Contact:  Fair  Speech:  Clear and Coherent  Volume:  Normal  Mood:  Anxious and Euthymic  Affect:  Appropriate and Congruent  Thought Process:  Coherent and Goal Directed  Orientation:  Full (Time, Place, and Person)  Thought Content: Logical   Suicidal Thoughts:  No  Homicidal Thoughts:  No  Memory:  Immediate;   Good   Judgement:  Good  Insight:  Fair  Psychomotor Activity:  Decreased  Concentration:  Concentration: Fair  Recall:  Fair  Fund of Knowledge: Fair  Language: Good  Akathisia:  NA    AIMS (if indicated): not done  Assets:  Communication Skills Desire for Improvement Housing Vocational/Educational  ADL's:  Intact  Cognition: WNL  Sleep:  Fair   Metabolic Disorder Labs: No results found for: "HGBA1C", "MPG" No results found for: "PROLACTIN" Lab Results  Component Value Date   CHOL 191 06/04/2023   TRIG 61.0 06/04/2023   HDL 43.60 06/04/2023   CHOLHDL 4 06/04/2023   VLDL 12.2 06/04/2023   LDLCALC 135 (H) 06/04/2023   LDLCALC 123 (H) 03/22/2020   Lab Results  Component Value Date   TSH 0.92 06/04/2023   TSH 0.68 05/14/2022    Therapeutic Level Labs: No results found for: "LITHIUM" No results found for: "VALPROATE" No results found for: "CBMZ"   Screenings: GAD-7    Flowsheet Row Office Visit from 06/04/2023 in [facility] [employer] Visit from 03/23/2021 in [facility] [employer] Visit from 03/21/2020 in [facility] [employer]  Total GAD-7 Score 12 13 7       PHQ2-9    Flowsheet Row Office Visit from 06/04/2023 in [facility] Visit from 05/10/2022 in [facility] [employer] Visit from 03/23/2021 in [facility] [employer] Visit from 03/21/2020 in [facility] [employer]  PHQ-2 Total Score 1 4 2 2   PHQ-9 Total Score 11 15 12 5       Flowsheet Row Admission (Discharged) from 06/09/2020 in [facility]  C-SSRS RISK CATEGORY No Risk (P)        Collaboration of Care: Collaboration of Care: Medication Management AEB medication prescription and Other provider involved in patient's care AEB PCP chart review  Patient/Guardian was  advised Release of Information must be obtained prior to any record release in order to collaborate their care with an outside provider. Patient/Guardian was advised if they have not already done so to contact the registration department to sign all necessary forms in order for us  to release information regarding their care.   Consent: Patient/Guardian gives verbal consent for treatment and assignment of benefits for services provided during this visit. Patient/Guardian expressed understanding and agreed to proceed.    Yves Herb, MD 09/16/2023, 8:54 AM   Virtual Visit via Video Note  I connected with Augusto Blonder on 09/16/23 at  9:00 AM EDT by a video enabled telemedicine application and verified that I am speaking with the correct person using two identifiers.  Location: Patient: Home Provider: Home Office   I discussed the limitations of evaluation and management by telemedicine and the availability of in person appointments. The patient expressed understanding and agreed to proceed.   I discussed the assessment and treatment plan with the patient. The patient was provided an opportunity to ask questions and all were answered. The patient agreed with the plan and demonstrated an understanding of the instructions.   The patient was advised to call back or seek an in-person evaluation if the symptoms worsen or if the condition fails to improve as anticipated.  40 minutes were spent in chart review, interview, psycho education, counseling, medical decision making, coordination of care and long-term prognosis.  Patient was given opportunity  to ask question and all concerns and questions were addressed and answers. Excluding separately billable services.   Yves Herb, MD

## 2023-09-17 ENCOUNTER — Telehealth (HOSPITAL_COMMUNITY): Admitting: Psychiatry

## 2023-09-17 DIAGNOSIS — F411 Generalized anxiety disorder: Secondary | ICD-10-CM

## 2023-09-17 DIAGNOSIS — F330 Major depressive disorder, recurrent, mild: Secondary | ICD-10-CM

## 2023-09-22 DIAGNOSIS — H6123 Impacted cerumen, bilateral: Secondary | ICD-10-CM | POA: Diagnosis not present

## 2023-09-22 DIAGNOSIS — H9191 Unspecified hearing loss, right ear: Secondary | ICD-10-CM | POA: Diagnosis not present

## 2023-09-22 DIAGNOSIS — H9201 Otalgia, right ear: Secondary | ICD-10-CM | POA: Diagnosis not present

## 2023-10-01 ENCOUNTER — Ambulatory Visit (INDEPENDENT_AMBULATORY_CARE_PROVIDER_SITE_OTHER): Admitting: Family Medicine

## 2023-10-01 ENCOUNTER — Encounter: Payer: Self-pay | Admitting: Family Medicine

## 2023-10-01 VITALS — BP 104/72 | HR 91 | Temp 97.6°F | Ht 68.0 in | Wt 143.6 lb

## 2023-10-01 DIAGNOSIS — D6859 Other primary thrombophilia: Secondary | ICD-10-CM

## 2023-10-01 DIAGNOSIS — Z862 Personal history of diseases of the blood and blood-forming organs and certain disorders involving the immune mechanism: Secondary | ICD-10-CM | POA: Diagnosis not present

## 2023-10-01 NOTE — Progress Notes (Signed)
 Established Patient Office Visit   Subjective:  Patient ID: Amanda Daugherty, female    DOB: [birth year]  Age: 28 y.o. MRN: [id number]  Chief Complaint  Patient presents with   Eye Problem    Bilateral eye lid problem. Brusing x 1 day. Pt wants platelets checked today.    bleeding gums    Bleeding gums x 1 week.     Eye Problem  Pertinent negatives include no blurred vision, eye discharge, eye redness, tingling or weakness.   Encounter Diagnoses  Name Primary?   History of ITP Yes   Protein S deficiency (HCC)    Recently noticed petechiae on her upper eyelids.  This is of particular concern to her because of her history of ITP as a child.  These petechiae with warm just prior to an episode of ITP.  History of protein S deficiency.  No history of DVT.  Takes Eliquis  and uses compression stockings for airline travel over 4 hours.  She has noted some bleeding around her gums with brushing.  She has noted some small bruises on her legs that are resolving.   Review of Systems  Constitutional: Negative.   HENT: Negative.    Eyes:  Negative for blurred vision, discharge and redness.  Respiratory: Negative.    Cardiovascular: Negative.   Gastrointestinal:  Negative for abdominal pain.  Genitourinary: Negative.   Musculoskeletal: Negative.  Negative for myalgias.  Skin:  Positive for rash.  Neurological:  Negative for tingling, loss of consciousness and weakness.  Endo/Heme/Allergies:  Negative for polydipsia.     Current Outpatient Medications:    buPROPion  (WELLBUTRIN  XL) 300 MG 24 hr tablet, TAKE 1 TABLET BY MOUTH EVERY MORNING, Disp: 90 tablet, Rfl: 0   clindamycin -benzoyl peroxide (BENZACLIN) gel, Apply topically 2 (two) times daily., Disp: 25 g, Rfl: 1   clonazePAM  (KLONOPIN ) 0.5 MG tablet, Take 1 tablet (0.5 mg total) by mouth daily as needed for anxiety. Pt states she takes monthly as needed, Disp: 30 tablet, Rfl: 0   DULoxetine  (CYMBALTA ) 30 MG capsule, TAKE 1 CAPSULE BY  MOUTH DAILY, Disp: 90 capsule, Rfl: 0   EPINEPHrine  0.3 mg/0.3 mL IJ SOAJ injection, Inject 0.3 mg into the muscle as needed for anaphylaxis., Disp: 1 each, Rfl: 1   Ferrous Sulfate (IRON PO), Take by mouth., Disp: , Rfl:    hydrOXYzine  (ATARAX ) 25 MG tablet, Take 2 tablets (50 mg total) by mouth every evening. May also take 1 tablet (25 mg total) at bedtime as needed., Disp: 90 tablet, Rfl: 2   levonorgestrel (KYLEENA) 19.5 MG IUD, Kyleena 17.5 mcg/24 hrs (75yrs) 19.5mg  intrauterine device  Take by intrauterine route., Disp: , Rfl:    linaclotide  (LINZESS ) 290 MCG CAPS capsule, Take 1 capsule (290 mcg total) by mouth daily before breakfast., Disp: 90 capsule, Rfl: 3   Multiple Vitamin (MULTIVITAMIN ADULT PO), Take by mouth daily., Disp: , Rfl:    OVER THE COUNTER MEDICATION, , Disp: , Rfl:    Probiotic Product (PROBIOTIC DAILY) CAPS, Take by mouth daily., Disp: , Rfl:    rivaroxaban  (XARELTO ) 10 MG TABS tablet, Start taking 10mg  po once daily 24hours before and continue for 48 hours after any long distance travel exceeding 4-6 hours for DVT prophylaxis in context of protein S deficiency., Disp: 30 tablet, Rfl: 0   rizatriptan  (MAXALT ) 10 MG tablet, Take 1 tablet (10 mg total) by mouth as needed for migraine. May repeat in 2 hours if needed, Disp: 10 tablet, Rfl: 10  spironolactone (ALDACTONE) 50 MG tablet, Take 50 mg by mouth daily., Disp: , Rfl:    SUMAtriptan  (IMITREX ) 100 MG tablet, Take 100 mg by mouth every 2 (two) hours as needed for migraine., Disp: , Rfl:    Objective:     BP 104/72   Pulse 91   Temp 97.6 F (36.4 C) (Temporal)   Ht 5' 8 (1.727 m)   Wt 143 lb 9.6 oz (65.1 kg)   LMP  (LMP Unknown)   SpO2 100%   BMI 21.83 kg/m    Physical Exam Constitutional:      General: She is not in acute distress.    Appearance: Normal appearance. She is not ill-appearing, toxic-appearing or diaphoretic.  HENT:     Head: Normocephalic and atraumatic.      Right Ear: External ear  normal.     Left Ear: External ear normal.     Mouth/Throat:     Mouth: Mucous membranes are moist.     Pharynx: Oropharynx is clear. No oropharyngeal exudate or posterior oropharyngeal erythema.    Eyes:     General: No scleral icterus.       Right eye: No discharge.        Left eye: No discharge.     Extraocular Movements: Extraocular movements intact.     Conjunctiva/sclera: Conjunctivae normal.     Pupils: Pupils are equal, round, and reactive to light.    Cardiovascular:     Rate and Rhythm: Normal rate and regular rhythm.  Pulmonary:     Effort: Pulmonary effort is normal. No respiratory distress.     Breath sounds: Normal breath sounds. No wheezing or rales.  Abdominal:     General: Bowel sounds are normal.   Musculoskeletal:     Cervical back: No rigidity or tenderness.  Lymphadenopathy:     Cervical: No cervical adenopathy.   Skin:    General: Skin is warm and dry.       Neurological:     Mental Status: She is alert and oriented to person, place, and time.   Psychiatric:        Mood and Affect: Mood normal.        Behavior: Behavior normal.      No results found for any visits on 10/01/23.    The ASCVD Risk score (Arnett DK, et al., 2019) failed to calculate for the following reasons:   The 2019 ASCVD risk score is only valid for ages 52 to 56    Assessment & Plan:   History of ITP -     CBC with Differential/Platelet -     C-reactive protein -     Sedimentation rate  Protein S deficiency (HCC)    Return if symptoms worsen or fail to improve.    Elsie Sim Lent, MD

## 2023-10-02 LAB — CBC WITH DIFFERENTIAL/PLATELET
Basophils Absolute: 0 10*3/uL (ref 0.0–0.1)
Basophils Relative: 0.7 % (ref 0.0–3.0)
Eosinophils Absolute: 0.1 10*3/uL (ref 0.0–0.7)
Eosinophils Relative: 1.1 % (ref 0.0–5.0)
HCT: 41.9 % (ref 36.0–46.0)
Hemoglobin: 14 g/dL (ref 12.0–15.0)
Lymphocytes Relative: 39.5 % (ref 12.0–46.0)
Lymphs Abs: 2.5 10*3/uL (ref 0.7–4.0)
MCHC: 33.4 g/dL (ref 30.0–36.0)
MCV: 91.8 fl (ref 78.0–100.0)
Monocytes Absolute: 0.5 10*3/uL (ref 0.1–1.0)
Monocytes Relative: 8.3 % (ref 3.0–12.0)
Neutro Abs: 3.1 10*3/uL (ref 1.4–7.7)
Neutrophils Relative %: 50.4 % (ref 43.0–77.0)
Platelets: 258 10*3/uL (ref 150.0–400.0)
RBC: 4.57 Mil/uL (ref 3.87–5.11)
RDW: 12.3 % (ref 11.5–15.5)
WBC: 6.2 10*3/uL (ref 4.0–10.5)

## 2023-10-02 LAB — C-REACTIVE PROTEIN: CRP: <1 mg/dL (ref 0.5–20.0)

## 2023-10-02 LAB — SEDIMENTATION RATE: Sed Rate: 2 mm/h (ref 0–20)

## 2023-10-03 ENCOUNTER — Ambulatory Visit: Payer: Self-pay | Admitting: Family Medicine

## 2023-10-14 NOTE — Progress Notes (Addendum)
 BH MD/PA/NP OP Progress Note  10/15/2023 5:13 PM MELONEE GERSTEL  MRN:  [id number]  Visit Diagnosis:    ICD-10-CM   1. GAD (generalized anxiety disorder)  F41.1 clonazePAM  (KLONOPIN ) 0.5 MG tablet    hydrOXYzine  (ATARAX ) 25 MG tablet    buPROPion  (WELLBUTRIN  XL) 300 MG 24 hr tablet    2. Mild episode of recurrent major depressive disorder (HCC)  F33.0 buPROPion  (WELLBUTRIN  XL) 300 MG 24 hr tablet       Assessment: REX MAGEE is a 28 y.o. female with a history of GAD who presented to [facility] at [facility] for initial evaluation on 06/28/2022.  At initial evaluation patient reported symptoms of anxiety including racing thoughts, constant worry that she is unable to control, difficulty relaxing, restlessness, increased irritability, and feelings of imposter syndrome that is affecting her day to day. She has experienced panic attacks in the past. Patient also endorsed neurovegetative symptoms of depression including low mood, anhedonia, sleep disturbances, decreased appetite, difficulty concentrating, and fatigue. She denied any SI/HI or thoughts of self harm. Psychosocially patient is dealing with multiple stressors including her fiance living in another country and increased stress after starting a new job. She met criteria for GAD and MDD with further neuropsych testing being needed to confirm ADHD.  Aleck CHRISTELLA Nephew presents for follow-up evaluation. Today, 10/15/23, patient reports a decrease in overall stressors over the past month and a half following completion of her wedding.  With that anxiety symptoms have improved.  She has self tapered the Cymbalta  to every other day due to afternoon fatigue and decreased anxiety.  Notably patient has been experiencing increased irritability.  This is likely in part due to the way she is currently taking Cymbalta  and intermittently experiencing withdrawal symptoms when she skips a dose.  We discussed discontinuing  Cymbalta  today.  There are some ongoing interpersonal stressors and discussed resources for couples therapy.  Psychotherapeutic interventions were used during today's session. From 3:07 PM to 3:29 PM. Therapeutic interventions included empathic listening, supportive therapy, cognitive and behavioral therapy, motivational interviewing. Used supportive interviewing techniques to provide emotional validation. Worked on cognitive reframing techniques and unhelpful thoughts challenged as appropriate. Alternative thoughts developed with guidance. Reviewed some techniques to facilitate increased behavioral activation. Improvement was evidenced by patient's participation and identified commitment to therapy goals.    Plan: - Discontinue Cymbalta  30 mg QD - Continue Wellbutrin  300 mg XL daily - Continue Atarax  25 mg at bedtime and 25 mg prn - Continue Klonopin  0.5 mg QD prn for anxiety - Recommended light therapy - Continue therapy through [facility] - Neuropsych testing referral - Crisis resources reviewed - Follow up in 3 months  Chief Complaint:  Chief Complaint  Patient presents with   Follow-up   HPI: Izabella presents reporting that things have gone well for her over the past 4 months. She got married on 5/31 and was promoted to a week before the wedding which resulted in her being quite stressed leading up to it.  After the wedding however patient has been going through a weird transition of having increased free time and decrease stress.  It is taking her some time to get used to it but overall she feels like the anxiety has begun to come down.  Now that the anxiety is less significant she started to feel like the Cymbalta  had been making her tired.  She trialed holding it roughly every other day and noticed that the fatigue improved on the  day she held the medicine.  That said she has been dealing with increased irritability and a shorter fuse the past couple months.  This appears to be  secondary to a number of causes.  1 contributing factor is the relationship with her partner.  He seems to have been more depressed over the last few months with the shift in his overall personality.  His inability to work as frequently, travel internationally, and tighter finances until he gets his green card are likely contributing to this.  Saidee knows that the shift and personality has made things a bit more difficult she also feels guilt as he came here for her.  She has considered looking into couples counseling as an avenue to address their overall communication and perhaps him develop some better insight into his current suspected depression.  Did review the benefits of couples therapy and provided resources patient can reach out to.  Patient is also connected with a therapist through [facility].  She has not met with them recently and was encouraged to consider reaching out or potentially trying an alternative provider through them.  In regards to medications we discussed holding the Cymbalta  moving forward as several of her stressors have improved.  It is likely that her irritability is in part due to going on and off the Cymbalta  and related withdrawal.  We can monitor her anxiety symptoms if they do return to could always consider changing Cymbalta  dosing to the evening in the future to better manage fatigue symptoms.  Patient continues to use the hydroxyzine  for sleep with good effect taking 25 mg on weekdays and 50 mg on weekends.  She takes the Klonopin  intermittently usually using the 0.5 mg tab on the evening she is more stressed or irritable.  She is using it appropriately with 30 tabs lasting around 6 months.   Past Psychiatric History: She denies any prior psychiatric hospitalizations or suicide attempts  Has tried Atarax  for itching in the past, does not recall adverse side effects. Currently on Cymbalta  and Klonopin .  Denies any substance use other than one cup of caffeine a day  or occasional social use.   Past Medical History:  Past Medical History:  Diagnosis Date   Chronic idiopathic constipation    GI-- dr stark   Endometriosis    Family history of protein S deficiency    pt's mother   History of ITP    IBS (irritable bowel syndrome)    Migraines    Pelvic pain    Protein S deficiency [facility])    hematology/ oncology--- dr onesimo   (06-06-2020  per pt has never had clot)   Protein S deficiency (HCC)     Past Surgical History:  Procedure Laterality Date   LAPAROSCOPIC ENDOMETRIOSIS FULGURATION     LAPAROSCOPY N/A 06/09/2020   Procedure: LAPAROSCOPY DIAGNOSTIC, CAUTERY OF ENDOMETRIOSIS;  Surgeon: Leva Rush, MD;  Location: [facility] [place];  Service: Gynecology;  Laterality: N/A;   TONSILLECTOMY  2017   Family History:  Family History  Problem Relation Age of Onset   Healthy Mother    Clotting disorder Mother    Healthy Father    Hyperlipidemia Father    Colon cancer Maternal Grandmother    Uterine cancer Maternal Grandmother    Diabetes Maternal Grandfather    Heart disease Maternal Grandfather    Heart disease Paternal Grandfather    Melanoma Paternal Grandfather    Liver disease Neg Hx    Pancreatic cancer Neg Hx  Esophageal cancer Neg Hx    Stomach cancer Neg Hx     Social History:  Social History   Socioeconomic History   Marital status: Single    Spouse name: Not on file   Number of children: Not on file   Years of education: Not on file   Highest education level: Not on file  Occupational History   Not on file  Tobacco Use   Smoking status: Never    Passive exposure: Never   Smokeless tobacco: Never  Vaping Use   Vaping status: Never Used  Substance and Sexual Activity   Alcohol use: Not Currently    Comment: social   Drug use: Never   Sexual activity: Not on file  Other Topics Concern   Not on file  Social History Narrative   Not on file   Social Drivers of Health   Financial Resource Strain: Not on  file  Food Insecurity: Not on file  Transportation Needs: Not on file  Physical Activity: Not on file  Stress: Not on file  Social Connections: Not on file    Allergies: No Known Allergies  Current Medications: Current Outpatient Medications  Medication Sig Dispense Refill   buPROPion  (WELLBUTRIN  XL) 300 MG 24 hr tablet Take 1 tablet (300 mg total) by mouth every morning. 90 tablet 0   clindamycin -benzoyl peroxide (BENZACLIN) gel Apply topically 2 (two) times daily. 25 g 1   clonazePAM  (KLONOPIN ) 0.5 MG tablet Take 1 tablet (0.5 mg total) by mouth daily as needed for anxiety. Pt states she takes monthly as needed 30 tablet 0   EPINEPHrine  0.3 mg/0.3 mL IJ SOAJ injection Inject 0.3 mg into the muscle as needed for anaphylaxis. 1 each 1   Ferrous Sulfate (IRON PO) Take by mouth.     hydrOXYzine  (ATARAX ) 25 MG tablet Take 2 tablets (50 mg total) by mouth every evening. May also take 1 tablet (25 mg total) at bedtime as needed. 90 tablet 2   levonorgestrel (KYLEENA) 19.5 MG IUD Kyleena 17.5 mcg/24 hrs (35yrs) 19.5mg  intrauterine device  Take by intrauterine route.     linaclotide  (LINZESS ) 290 MCG CAPS capsule Take 1 capsule (290 mcg total) by mouth daily before breakfast. 90 capsule 3   Multiple Vitamin (MULTIVITAMIN ADULT PO) Take by mouth daily.     OVER THE COUNTER MEDICATION      Probiotic Product (PROBIOTIC DAILY) CAPS Take by mouth daily.     rivaroxaban  (XARELTO ) 10 MG TABS tablet Start taking 10mg  po once daily 24hours before and continue for 48 hours after any long distance travel exceeding 4-6 hours for DVT prophylaxis in context of protein S deficiency. 30 tablet 0   rizatriptan  (MAXALT ) 10 MG tablet Take 1 tablet (10 mg total) by mouth as needed for migraine. May repeat in 2 hours if needed 10 tablet 10   spironolactone (ALDACTONE) 50 MG tablet Take 50 mg by mouth daily.     SUMAtriptan  (IMITREX ) 100 MG tablet Take 100 mg by mouth every 2 (two) hours as needed for migraine.      No current facility-administered medications for this visit.     Psychiatric Specialty Exam: Review of Systems  There were no vitals taken for this visit.There is no height or weight on file to calculate BMI.  General Appearance: Fairly Groomed  Eye Contact:  Fair  Speech:  Clear and Coherent  Volume:  Normal  Mood:  Anxious and Euthymic  Affect:  Appropriate and Congruent  Thought Process:  Coherent  and Goal Directed  Orientation:  Full (Time, Place, and Person)  Thought Content: Logical   Suicidal Thoughts:  No  Homicidal Thoughts:  No  Memory:  Immediate;   Good  Judgement:  Good  Insight:  Fair  Psychomotor Activity:  Decreased  Concentration:  Concentration: Fair  Recall:  Fair  Fund of Knowledge: Fair  Language: Good  Akathisia:  NA    AIMS (if indicated): not done  Assets:  Communication Skills Desire for Improvement Housing Vocational/Educational  ADL's:  Intact  Cognition: WNL  Sleep:  Fair   Metabolic Disorder Labs: No results found for: HGBA1C, MPG No results found for: PROLACTIN Lab Results  Component Value Date   CHOL 191 06/04/2023   TRIG 61.0 06/04/2023   HDL 43.60 06/04/2023   CHOLHDL 4 06/04/2023   VLDL 12.2 06/04/2023   LDLCALC 135 (H) 06/04/2023   LDLCALC 123 (H) 03/22/2020   Lab Results  Component Value Date   TSH 0.92 06/04/2023   TSH 0.68 05/14/2022    Therapeutic Level Labs: No results found for: LITHIUM No results found for: VALPROATE No results found for: CBMZ   Screenings: GAD-7    Flowsheet Row Office Visit from 06/04/2023 in [facility] [employer] Visit from 03/23/2021 in [facility] [place] [facility] [employer] Visit from 03/21/2020 in [facility] [employer]  Total GAD-7 Score 12 13 7    PHQ2-9    Flowsheet Row Office Visit from 10/01/2023 in [facility] Visit from 06/04/2023 in  [facility] [place] [facility] [employer] Visit from 05/10/2022 in [facility] [place] [facility] [employer] Visit from 03/23/2021 in [facility] [place] [facility] [employer] Visit from 03/21/2020 in [facility] [place] [facility] [employer]  PHQ-2 Total Score 0 1 4 2 2   PHQ-9 Total Score -- 11 15 12 5    Flowsheet Row Admission (Discharged) from 06/09/2020 in [facility]  C-SSRS RISK CATEGORY No Risk (P)     Collaboration of Care: Collaboration of Care: Medication Management AEB medication prescription and Other provider involved in patient's care AEB PCP chart review  Patient/Guardian was advised Release of Information must be obtained prior to any record release in order to collaborate their care with an outside provider. Patient/Guardian was advised if they have not already done so to contact the registration department to sign all necessary forms in order for us  to release information regarding their care.   Consent: Patient/Guardian gives verbal consent for treatment and assignment of benefits for services provided during this visit. Patient/Guardian expressed understanding and agreed to proceed.    Arvella CHRISTELLA Finder, MD 10/15/2023, 5:13 PM   Virtual Visit via Video Note  I connected with Aleck Nephew on 10/15/23 at  3:00 PM EDT by a video enabled telemedicine application and verified that I am speaking with the correct person using two identifiers.  Location: Patient: Home Provider: Home Office   I discussed the limitations of evaluation and management by telemedicine and the availability of in person appointments. The patient expressed understanding and agreed to proceed.   I discussed the assessment and treatment plan with the patient. The patient was provided an opportunity to ask questions and all were answered. The patient agreed with the plan and demonstrated an understanding of the instructions.   The patient was  advised to call back or seek an in-person evaluation if the symptoms worsen or if the  condition fails to improve as anticipated.  40 minutes were spent in chart review, interview, psycho education, counseling, medical decision making, coordination of care and long-term prognosis.  Patient was given opportunity to ask question and all concerns and questions were addressed and answers. Excluding separately billable services.   Arvella CHRISTELLA Finder, MD

## 2023-10-15 ENCOUNTER — Telehealth (HOSPITAL_COMMUNITY): Admitting: Psychiatry

## 2023-10-15 ENCOUNTER — Encounter (HOSPITAL_COMMUNITY): Payer: Self-pay | Admitting: Psychiatry

## 2023-10-15 DIAGNOSIS — F411 Generalized anxiety disorder: Secondary | ICD-10-CM

## 2023-10-15 DIAGNOSIS — F330 Major depressive disorder, recurrent, mild: Secondary | ICD-10-CM

## 2023-10-15 MED ORDER — HYDROXYZINE HCL 25 MG PO TABS: ORAL_TABLET | ORAL | 2 refills | Status: DC

## 2023-10-15 MED ORDER — BUPROPION HCL ER (XL) 300 MG PO TB24
300.0000 mg | ORAL_TABLET | Freq: Every morning | ORAL | 0 refills | Status: DC
Start: 2023-10-15 — End: 2023-12-26

## 2023-10-15 MED ORDER — CLONAZEPAM 0.5 MG PO TABS: 0.5000 mg | ORAL_TABLET | Freq: Every day | ORAL | 0 refills | Status: DC | PRN

## 2023-10-27 ENCOUNTER — Encounter (HOSPITAL_COMMUNITY): Payer: Self-pay

## 2023-10-28 ENCOUNTER — Other Ambulatory Visit (HOSPITAL_COMMUNITY): Payer: Self-pay

## 2023-10-28 DIAGNOSIS — F411 Generalized anxiety disorder: Secondary | ICD-10-CM

## 2023-10-28 MED ORDER — HYDROXYZINE HCL 25 MG PO TABS: ORAL_TABLET | ORAL | 0 refills | Status: DC

## 2023-12-19 NOTE — Progress Notes (Signed)
 Cardiology Office Note Date:  12/23/2023  ID:  Amanda Daugherty, DOB 01-28-1996, MRN [id number] PCP:  Katheen Roselie Rockford, NP  Cardiologist: Joelle VEAR Ren Donley, MD  Chief Complaint  Patient presents with   Dizziness      Problems Orthostasis c/f POTS vs. OH OH positive in PCP office Drinking fluids/ECG Normal Held spiro  Visits  09/15: TTE, 7-day event monitor, planning to start having kid soon    History of Present Illness: Amanda Daugherty is a 28 y.o. female who presents for orthostasis concern for POTS vs. OH.  She has had history of orthostasis for many years that has gotten worse over the last 2 years, but overall plateaued over last year. She reports almost daily episodes of blurred vision and dizziness that last a few seconds up sitting from lying or standing from sitting, though can occur a few minutes later. She occasionally has some LE weakness associated with it as well. She denies any chest palpitations or dyspnea w/ exertion but has not done any cardio due to concern for symptoms onset. She has increased water and electrolyte intake.   ROS: Please see the history of present illness. All other systems are reviewed and negative.   Past Medical History:  Diagnosis Date   Chronic idiopathic constipation    GI-- dr stark   Endometriosis    Family history of protein S deficiency    pt's mother   History of ITP    IBS (irritable bowel syndrome)    Migraines    Pelvic pain    Protein S deficiency [facility])    hematology/ oncology--- dr onesimo   (06-06-2020  per pt has never had clot)   Protein S deficiency (HCC)     Past Surgical History:  Procedure Laterality Date   LAPAROSCOPIC ENDOMETRIOSIS FULGURATION     LAPAROSCOPY N/A 06/09/2020   Procedure: LAPAROSCOPY DIAGNOSTIC, CAUTERY OF ENDOMETRIOSIS;  Surgeon: Leva Rush, MD;  Location: [facility] [place];  Service: Gynecology;  Laterality: N/A;   TONSILLECTOMY  2017    Current Outpatient Medications   Medication Sig Dispense Refill   buPROPion  (WELLBUTRIN  XL) 300 MG 24 hr tablet Take 1 tablet (300 mg total) by mouth every morning. 90 tablet 0   clonazePAM  (KLONOPIN ) 0.5 MG tablet Take 1 tablet (0.5 mg total) by mouth daily as needed for anxiety. Pt states she takes monthly as needed 30 tablet 0   EPINEPHrine  0.3 mg/0.3 mL IJ SOAJ injection Inject 0.3 mg into the muscle as needed for anaphylaxis. 1 each 1   hydrOXYzine  (ATARAX ) 25 MG tablet Take 2 tablets (50 mg total) by mouth every evening. May also take 1 tablet (25 mg total) at bedtime as needed. 270 tablet 0   levonorgestrel (KYLEENA) 19.5 MG IUD Kyleena 17.5 mcg/24 hrs (21yrs) 19.5mg  intrauterine device  Take by intrauterine route.     linaclotide  (LINZESS ) 290 MCG CAPS capsule Take 1 capsule (290 mcg total) by mouth daily before breakfast. 90 capsule 3   Multiple Vitamin (MULTIVITAMIN ADULT PO) Take by mouth daily.     OVER THE COUNTER MEDICATION      Probiotic Product (PROBIOTIC DAILY) CAPS Take by mouth daily.     rivaroxaban  (XARELTO ) 10 MG TABS tablet Start taking 10mg  po once daily 24hours before and continue for 48 hours after any long distance travel exceeding 4-6 hours for DVT prophylaxis in context of protein S deficiency. 30 tablet 0   rizatriptan  (MAXALT ) 10 MG tablet Take 1 tablet (  10 mg total) by mouth as needed for migraine. May repeat in 2 hours if needed 10 tablet 10   spironolactone (ALDACTONE) 50 MG tablet Take 25 mg by mouth daily.     SUMAtriptan  (IMITREX ) 100 MG tablet Take 100 mg by mouth every 2 (two) hours as needed for migraine.     No current facility-administered medications for this visit.    Allergies:   Patient has no known allergies.   Social History:  No drinking, smoking, or substance use  Family History:  Mother w/ MVP   PHYSICAL EXAM: VS:  BP 121/82   Pulse 87   Ht 5' 8 (1.727 m)   Wt 140 lb 9.6 oz (63.8 kg)   SpO2 98%   BMI 21.38 kg/m  , BMI Body mass index is 21.38 kg/m. GEN: Well  nourished, well developed, in no acute distress HEENT: normal Neck: no JVD, carotid bruits, or masses Cardiac:RRR; no murmurs, rubs, or gallops,no edema  Respiratory:  CTAB bilaterally, normal work of breathing GI: soft, nontender, nondistended, + BS Extremities: No LE edema Skin: warm and dry, no rash Neuro:  Strength and sensation are intact  EKG: NSR  Recent Labs: Reviewed  Studies: Reviewed  ASSESSMENT AND PLAN: Amanda Daugherty is a 28 y.o. female who presents for orthostasis symptoms. #Orthostasis - Patient presenting with orthostasis for at least two years that is now stable but has been a barrier for her to do cardio and symptoms are occurring daily. Orthostatic vitals did not confirm the diagnosis of OH but patient became very symptomatic on standing. I still believe that she has OH and recommended compression stockings and/or abdominal binder. Will also order TTE and 7-day event monitor for further evaluation.  - Follow up in 3 months and will consider Droxidopa if still symptomatic.    Signed, Joelle VEAR Ren Donley, MD  12/23/2023 2:56 PM    [facility]

## 2023-12-23 ENCOUNTER — Ambulatory Visit

## 2023-12-23 ENCOUNTER — Other Ambulatory Visit: Payer: Self-pay

## 2023-12-23 VITALS — BP 121/82 | HR 87 | Ht 68.0 in | Wt 140.6 lb

## 2023-12-23 DIAGNOSIS — R42 Dizziness and giddiness: Secondary | ICD-10-CM | POA: Diagnosis not present

## 2023-12-23 DIAGNOSIS — R000 Tachycardia, unspecified: Secondary | ICD-10-CM

## 2023-12-23 DIAGNOSIS — I951 Orthostatic hypotension: Secondary | ICD-10-CM

## 2023-12-23 NOTE — Patient Instructions (Signed)
 Medication Instructions:  Your physician recommends that you continue on your current medications as directed. Please refer to the Current Medication list given to you today.  *If you need a refill on your cardiac medications before your next appointment, please call your pharmacy*  Lab Work: None ordered.  If you have labs (blood work) drawn today and your tests are completely normal, you will receive your results only by: MyChart Message (if you have MyChart) OR A paper copy in the mail If you have any lab test that is abnormal or we need to change your treatment, we will call you to review the results.  Testing/Procedures: Your physician has requested that you have an echocardiogram. Echocardiography is a painless test that uses sound waves to create images of your heart. It provides your doctor with information about the size and shape of your heart and how well your heart's chambers and valves are working. This procedure takes approximately one hour. There are no restrictions for this procedure. Please do NOT wear cologne, perfume, aftershave, or lotions (deodorant is allowed). Please arrive 15 minutes prior to your appointment time.  Please note: We ask at that you not bring children with you during ultrasound (echo/ vascular) testing. Due to room size and safety concerns, children are not allowed in the ultrasound rooms during exams. Our front office staff cannot provide observation of children in our lobby area while testing is being conducted. An adult accompanying a patient to their appointment will only be allowed in the ultrasound room at the discretion of the ultrasound technician under special circumstances. We apologize for any inconvenience.   Follow-Up: At [facility], you and your health needs are our priority.  As part of our continuing mission to provide you with exceptional heart care, our providers are all part of one team.  This team includes your primary  Cardiologist (physician) and Advanced Practice Providers or APPs (Physician Assistants and Nurse Practitioners) who all work together to provide you with the care you need, when you need it.  Your next appointment:   3 months with Dr Ren  We recommend signing up for the patient portal called MyChart.  Sign up information is provided on this After Visit Summary.  MyChart is used to connect with patients for Virtual Visits (Telemedicine).  Patients are able to view lab/test results, encounter notes, upcoming appointments, etc.  Non-urgent messages can be sent to your provider as well.   To learn more about what you can do with MyChart, go to [email or web address].   Other Instructions ZIO XT- Long Term Monitor Instructions  Your physician has requested you wear a ZIO patch monitor for 7 days.  This is a single patch monitor. [employer] supplies one patch monitor per enrollment. Additional stickers are not available. Please do not apply patch if you will be having a Nuclear Stress Test,  Echocardiogram, Cardiac CT, MRI, or Chest Xray during the period you would be wearing the  monitor. The patch cannot be worn during these tests. You cannot remove and re-apply the  ZIO XT patch monitor.  Your ZIO patch monitor will be mailed 3 day USPS to your address on file. It may take 3-5 days  to receive your monitor after you have been enrolled.  Once you have received your monitor, please review the enclosed instructions. Your monitor  has already been registered assigning a specific monitor serial # to you.  Billing and Patient Assistance Program Information  We have supplied [employer] with any of  your insurance information on file for billing purposes. [employer] offers a sliding scale Patient Assistance Program for patients that do not have  insurance, or whose insurance does not completely cover the cost of the ZIO monitor.  You must apply for the Patient Assistance Program to qualify for this  discounted rate.  To apply, please call [employer] at [phone], select option 4, select option 2, ask to apply for  Patient Assistance Program. Meredeth will ask your household income, and how many people  are in your household. They will quote your out-of-pocket cost based on that information.  [employer] will also be able to set up a 41-month, interest-free payment plan if needed.  Applying the monitor   Shave hair from upper left chest.  Hold abrader disc by orange tab. Rub abrader in 40 strokes over the upper left chest as  indicated in your monitor instructions.  Clean area with 4 enclosed alcohol pads. Let dry.  Apply patch as indicated in monitor instructions. Patch will be placed under collarbone on left  side of chest with arrow pointing upward.  Rub patch adhesive wings for 2 minutes. Remove white label marked 1. Remove the white  label marked 2. Rub patch adhesive wings for 2 additional minutes.  While looking in a mirror, press and release button in center of patch. A small green light will  flash 3-4 times. This will be your only indicator that the monitor has been turned on.  Do not shower for the first 24 hours. You may shower after the first 24 hours.  Press the button if you feel a symptom. You will hear a small click. Record Date, Time and  Symptom in the Patient Logbook.  When you are ready to remove the patch, follow instructions on the last 2 pages of Patient  Logbook. Stick patch monitor onto the last page of Patient Logbook.  Place Patient Logbook in the blue and white box. Use locking tab on box and tape box closed  securely. The blue and white box has prepaid postage on it. Please place it in the mailbox as  soon as possible. Your physician should have your test results approximately 7 days after the  monitor has been mailed back to [facility].  Call [facility] Customer Care at [phone] if you have questions regarding  your ZIO XT patch monitor. Call  them immediately if you see an orange light blinking on your  monitor.  If your monitor falls off in less than 4 days, contact our Monitor department at [phone].  If your monitor becomes loose or falls off after 4 days call [employer] at [phone] for  suggestions on securing your monitor

## 2023-12-23 NOTE — Progress Notes (Unsigned)
 Enrolled for [employer] to mail a ZIO XT long term holter monitor to the patients address on file.

## 2023-12-23 NOTE — Progress Notes (Deleted)
 BH MD/PA/NP OP Progress Note  12/23/2023 4:44 PM Amanda Daugherty  MRN:  [id number]  Visit Diagnosis:  No diagnosis found.    Assessment: Amanda Daugherty is a 28 y.o. female with a history of GAD who presented to [facility] for initial evaluation on 06/28/2022.  At initial evaluation patient reported symptoms of anxiety including racing thoughts, constant worry that she is unable to control, difficulty relaxing, restlessness, increased irritability, and feelings of imposter syndrome that is affecting her day to day. She has experienced panic attacks in the past. Patient also endorsed neurovegetative symptoms of depression including low mood, anhedonia, sleep disturbances, decreased appetite, difficulty concentrating, and fatigue. She denied any SI/HI or thoughts of self harm. Psychosocially patient is dealing with multiple stressors including her fiance living in another country and increased stress after starting a new job. She met criteria for GAD and MDD with further neuropsych testing being needed to confirm ADHD.  Amanda Daugherty presents for follow-up evaluation. Today, 12/23/23, patient reports    a decrease in overall stressors over the past month and a half following completion of her wedding.  With that anxiety symptoms have improved.  She has self tapered the Cymbalta  to every other day due to afternoon fatigue and decreased anxiety.  Notably patient has been experiencing increased irritability.  This is likely in part due to the way she is currently taking Cymbalta  and intermittently experiencing withdrawal symptoms when she skips a dose.  We discussed discontinuing Cymbalta  today.  There are some ongoing interpersonal stressors and discussed resources for couples therapy.  Psychotherapeutic interventions were used during today's session. From 3:07 PM to 3:29 PM. Therapeutic interventions included empathic listening, supportive therapy, cognitive  and behavioral therapy, motivational interviewing. Used supportive interviewing techniques to provide emotional validation. Worked on cognitive reframing techniques and unhelpful thoughts challenged as appropriate. Alternative thoughts developed with guidance. Reviewed some techniques to facilitate increased behavioral activation. Improvement was evidenced by patient's participation and identified commitment to therapy goals.    Plan: - Discontinue Cymbalta  30 mg QD - Continue Wellbutrin  300 mg XL daily - Continue Atarax  25 mg at bedtime and 25 mg prn - Continue Klonopin  0.5 mg QD prn for anxiety - Recommended light therapy - Continue therapy through [facility] - Neuropsych testing referral - Crisis resources reviewed - Follow up in 3 months  Chief Complaint:  No chief complaint on file.  HPI: Amanda Daugherty presents reporting that     things have gone well for her over the past 4 months. She got married on 5/31 and was promoted to a week before the wedding which resulted in her being quite stressed leading up to it.  After the wedding however patient has been going through a weird transition of having increased free time and decrease stress.  It is taking her some time to get used to it but overall she feels like the anxiety has begun to come down.  Now that the anxiety is less significant she started to feel like the Cymbalta  had been making her tired.  She trialed holding it roughly every other day and noticed that the fatigue improved on the day she held the medicine.  That said she has been dealing with increased irritability and a shorter fuse the past couple months.  This appears to be secondary to a number of causes.  1 contributing factor is the relationship with her partner.  He seems to have been more depressed over the last few  months with the shift in his overall personality.  His inability to work as frequently, travel internationally, and tighter finances until he gets his green card  are likely contributing to this.  Amanda Daugherty knows that the shift and personality has made things a bit more difficult she also feels guilt as he came here for her.  She has considered looking into couples counseling as an avenue to address their overall communication and perhaps him develop some better insight into his current suspected depression.  Did review the benefits of couples therapy and provided resources patient can reach out to.  Patient is also connected with a therapist through [facility].  She has not met with them recently and was encouraged to consider reaching out or potentially trying an alternative provider through them.  In regards to medications we discussed holding the Cymbalta  moving forward as several of her stressors have improved.  It is likely that her irritability is in part due to going on and off the Cymbalta  and related withdrawal.  We can monitor her anxiety symptoms if they do return to could always consider changing Cymbalta  dosing to the evening in the future to better manage fatigue symptoms.  Patient continues to use the hydroxyzine  for sleep with good effect taking 25 mg on weekdays and 50 mg on weekends.  She takes the Klonopin  intermittently usually using the 0.5 mg tab on the evening she is more stressed or irritable.  She is using it appropriately with 30 tabs lasting around 6 months.   Past Psychiatric History: She denies any prior psychiatric hospitalizations or suicide attempts  Has tried Atarax  for itching in the past, does not recall adverse side effects. Currently on Cymbalta  and Klonopin .  Denies any substance use other than one cup of caffeine a day or occasional social use.   Past Medical History:  Past Medical History:  Diagnosis Date   Chronic idiopathic constipation    GI-- dr stark   Endometriosis    Family history of protein S deficiency    pt's mother   History of ITP    IBS (irritable bowel syndrome)    Migraines    Pelvic pain     Protein S deficiency [facility])    hematology/ oncology--- dr onesimo   (06-06-2020  per pt has never had clot)   Protein S deficiency (HCC)     Past Surgical History:  Procedure Laterality Date   LAPAROSCOPIC ENDOMETRIOSIS FULGURATION     LAPAROSCOPY N/A 06/09/2020   Procedure: LAPAROSCOPY DIAGNOSTIC, CAUTERY OF ENDOMETRIOSIS;  Surgeon: Leva Rush, MD;  Location: [facility];  Service: Gynecology;  Laterality: N/A;   TONSILLECTOMY  2017   Family History:  Family History  Problem Relation Age of Onset   Clotting disorder Mother    Mitral valve prolapse Mother    Hyperlipidemia Father    Colon cancer Maternal Grandmother    Uterine cancer Maternal Grandmother    Diabetes Maternal Grandfather    Heart disease Maternal Grandfather    Melanoma Paternal Grandmother    Heart disease Paternal Grandfather    Liver disease Neg Hx    Pancreatic cancer Neg Hx    Esophageal cancer Neg Hx    Stomach cancer Neg Hx     Social History:  Social History   Socioeconomic History   Marital status: Single    Spouse name: Not on file   Number of children: Not on file   Years of education: Not on file   Highest education  level: Not on file  Occupational History   Not on file  Tobacco Use   Smoking status: Never    Passive exposure: Never   Smokeless tobacco: Never  Vaping Use   Vaping status: Never Used  Substance and Sexual Activity   Alcohol use: Not Currently    Comment: social   Drug use: Never   Sexual activity: Not on file  Other Topics Concern   Not on file  Social History Narrative   Not on file   Social Drivers of Health   Financial Resource Strain: Not on file  Food Insecurity: Not on file  Transportation Needs: Not on file  Physical Activity: Not on file  Stress: Not on file  Social Connections: Not on file    Allergies: No Known Allergies  Current Medications: Current Outpatient Medications  Medication Sig Dispense Refill   buPROPion  (WELLBUTRIN  XL)  300 MG 24 hr tablet Take 1 tablet (300 mg total) by mouth every morning. 90 tablet 0   clonazePAM  (KLONOPIN ) 0.5 MG tablet Take 1 tablet (0.5 mg total) by mouth daily as needed for anxiety. Pt states she takes monthly as needed 30 tablet 0   EPINEPHrine  0.3 mg/0.3 mL IJ SOAJ injection Inject 0.3 mg into the muscle as needed for anaphylaxis. 1 each 1   hydrOXYzine  (ATARAX ) 25 MG tablet Take 2 tablets (50 mg total) by mouth every evening. May also take 1 tablet (25 mg total) at bedtime as needed. 270 tablet 0   levonorgestrel (KYLEENA) 19.5 MG IUD Kyleena 17.5 mcg/24 hrs (53yrs) 19.5mg  intrauterine device  Take by intrauterine route.     linaclotide  (LINZESS ) 290 MCG CAPS capsule Take 1 capsule (290 mcg total) by mouth daily before breakfast. 90 capsule 3   Multiple Vitamin (MULTIVITAMIN ADULT PO) Take by mouth daily.     OVER THE COUNTER MEDICATION      Probiotic Product (PROBIOTIC DAILY) CAPS Take by mouth daily.     rivaroxaban  (XARELTO ) 10 MG TABS tablet Start taking 10mg  po once daily 24hours before and continue for 48 hours after any long distance travel exceeding 4-6 hours for DVT prophylaxis in context of protein S deficiency. 30 tablet 0   rizatriptan  (MAXALT ) 10 MG tablet Take 1 tablet (10 mg total) by mouth as needed for migraine. May repeat in 2 hours if needed 10 tablet 10   spironolactone (ALDACTONE) 50 MG tablet Take 25 mg by mouth daily.     SUMAtriptan  (IMITREX ) 100 MG tablet Take 100 mg by mouth every 2 (two) hours as needed for migraine.     No current facility-administered medications for this visit.     Psychiatric Specialty Exam: Review of Systems  There were no vitals taken for this visit.There is no height or weight on file to calculate BMI.  General Appearance: Fairly Groomed  Eye Contact:  Fair  Speech:  Clear and Coherent  Volume:  Normal  Mood:  Anxious and Euthymic  Affect:  Appropriate and Congruent  Thought Process:  Coherent and Goal Directed  Orientation:   Full (Time, Place, and Person)  Thought Content: Logical   Suicidal Thoughts:  No  Homicidal Thoughts:  No  Memory:  Immediate;   Good  Judgement:  Good  Insight:  Fair  Psychomotor Activity:  Decreased  Concentration:  Concentration: Fair  Recall:  Fair  Fund of Knowledge: Fair  Language: Good  Akathisia:  NA    AIMS (if indicated): not done  Assets:  Communication Skills Desire for Improvement  Housing Vocational/Educational  ADL's:  Intact  Cognition: WNL  Sleep:  Fair   Metabolic Disorder Labs: No results found for: HGBA1C, MPG No results found for: PROLACTIN Lab Results  Component Value Date   CHOL 191 06/04/2023   TRIG 61.0 06/04/2023   HDL 43.60 06/04/2023   CHOLHDL 4 06/04/2023   VLDL 12.2 06/04/2023   LDLCALC 135 (H) 06/04/2023   LDLCALC 123 (H) 03/22/2020   Lab Results  Component Value Date   TSH 0.92 06/04/2023   TSH 0.68 05/14/2022    Therapeutic Level Labs: No results found for: LITHIUM No results found for: VALPROATE No results found for: CBMZ   Screenings: GAD-7    Flowsheet Row Office Visit from 06/04/2023 in [facility] [employer] Visit from 03/23/2021 in [facility] [place] [facility] [employer] Visit from 03/21/2020 in [facility] [place] [facility] [employer]  Total GAD-7 Score 12 13 7    PHQ2-9    Flowsheet Row Office Visit from 10/01/2023 in [facility] [place] [facility] Visit from 06/04/2023 in [facility] [place] [facility] Visit from 05/10/2022 in [facility] [place] [facility] [employer] Visit from 03/23/2021 in [facility] [place] [facility] [employer] Visit from 03/21/2020 in [facility] [place] [facility] [employer]  PHQ-2 Total Score 0 1 4 2 2   PHQ-9 Total Score -- 11 15 12 5    Flowsheet Row Admission (Discharged) from 06/09/2020 in [facility]  C-SSRS RISK  CATEGORY No Risk (P)     Collaboration of Care: Collaboration of Care: Medication Management AEB medication prescription and Other provider involved in patient's care AEB PCP chart review  Patient/Guardian was advised Release of Information must be obtained prior to any record release in order to collaborate their care with an outside provider. Patient/Guardian was advised if they have not already done so to contact the registration department to sign all necessary forms in order for us  to release information regarding their care.   Consent: Patient/Guardian gives verbal consent for treatment and assignment of benefits for services provided during this visit. Patient/Guardian expressed understanding and agreed to proceed.    Amanda CHRISTELLA Finder, MD 12/23/2023, 4:44 PM   Virtual Visit via Video Note  I connected with Amanda Daugherty on 12/23/23 at  3:30 PM EDT by a video enabled telemedicine application and verified that I am speaking with the correct person using two identifiers.  Location: Patient: Home Provider: Home Office   I discussed the limitations of evaluation and management by telemedicine and the availability of in person appointments. The patient expressed understanding and agreed to proceed.   I discussed the assessment and treatment plan with the patient. The patient was provided an opportunity to ask questions and all were answered. The patient agreed with the plan and demonstrated an understanding of the instructions.   The patient was advised to call back or seek an in-person evaluation if the symptoms worsen or if the condition fails to improve as anticipated.  40 minutes were spent in chart review, interview, psycho education, counseling, medical decision making, coordination of care and long-term prognosis.  Patient was given opportunity to ask question and all concerns and questions were addressed and answers. Excluding separately billable services.   Amanda CHRISTELLA Finder, MD

## 2023-12-26 ENCOUNTER — Other Ambulatory Visit (HOSPITAL_COMMUNITY): Payer: Self-pay | Admitting: Psychiatry

## 2023-12-26 ENCOUNTER — Telehealth (HOSPITAL_COMMUNITY): Admitting: Psychiatry

## 2023-12-26 DIAGNOSIS — F330 Major depressive disorder, recurrent, mild: Secondary | ICD-10-CM

## 2023-12-26 DIAGNOSIS — F411 Generalized anxiety disorder: Secondary | ICD-10-CM

## 2023-12-26 MED ORDER — BUPROPION HCL ER (XL) 300 MG PO TB24: 300.0000 mg | ORAL_TABLET | Freq: Every morning | ORAL | 0 refills | Status: DC

## 2023-12-26 MED ORDER — HYDROXYZINE HCL 25 MG PO TABS: ORAL_TABLET | ORAL | 0 refills | Status: DC

## 2023-12-26 MED ORDER — CLONAZEPAM 0.5 MG PO TABS: 0.5000 mg | ORAL_TABLET | Freq: Every day | ORAL | 0 refills | Status: DC | PRN

## 2024-01-06 NOTE — Progress Notes (Unsigned)
 BH MD/PA/NP OP Progress Note  01/07/2024 10:39 AM Amanda Daugherty  MRN:  [id number]  Visit Diagnosis:  No diagnosis found.  Assessment: Amanda Daugherty is a 28 y.o. female with a history of GAD who presented to [facility] at [facility] for initial evaluation on 06/28/2022.  At initial evaluation patient reported symptoms of anxiety including racing thoughts, constant worry that she is unable to control, difficulty relaxing, restlessness, increased irritability, and feelings of imposter syndrome that is affecting her day to day. She has experienced panic attacks in the past. Patient also endorsed neurovegetative symptoms of depression including low mood, anhedonia, sleep disturbances, decreased appetite, difficulty concentrating, and fatigue. She denied any SI/HI or thoughts of self harm. Psychosocially patient is dealing with multiple stressors including her fiance living in another country and increased stress after starting a new job. She met criteria for GAD and MDD with further neuropsych testing being needed to confirm ADHD.  Aleck CHRISTELLA Nephew presents for follow-up evaluation. Today, 01/07/24, patient reports overall improvement in mood.  She had some initial irritability with the discontinuation of Cymbalta  that improved after a month.  Currently she still endorses anxiety symptoms however they are less significant compared to the past.  Will continue on her current regimen and follow-up in 3 months.  Psychotherapeutic interventions were used during today's session. From 10:07 AM to 10:32 AM. Therapeutic interventions included empathic listening, supportive therapy, cognitive and behavioral therapy. Used supportive interviewing techniques to provide emotional validation. Worked on cognitive reframing techniques and unhelpful thoughts challenged as appropriate. Alternative thoughts developed with guidance.  Topics addressed included interpersonal concerns with partner,  anxiety and associated nausea, and minimizing of her own medical concerns.  Improvement was evidenced by patient's participation and identified commitment to therapy goals.    Plan: - Continue Wellbutrin  300 mg XL daily - Continue Atarax  25 mg at bedtime and 25 mg prn - Continue Klonopin  0.5 mg QD prn for anxiety - Recommended light therapy - Continue therapy through [facility] - Neuropsych testing referral - Crisis resources reviewed - Follow up in 3 months  Chief Complaint:  Chief Complaint  Patient presents with   Follow-up   HPI: Amanda Daugherty presents reporting that she is doing pretty good, for the first time she can say nice has been relatively normal/steady. After stopping the Cymbalta  she thinks it took her a month to adapt to the shifts of mood/irritability. But that has been better for the past 2 months.  Patient has had a good balance between work and home only having to travel once a month.  She and her partner also got to take a vacation recently which was very enjoyable.  In regards to her partner there is still some residual concern.  She had discussed therapy with him however he declined.  She does still feel his depression is present though there has been slight improvement.  The 2 have been having conversations which seems to have been beneficial.  Furthermore Amanda Daugherty identified that some of the concern was on her end.  With the perceived depression hyperfixation patient had felt that this was occurring due to his lack of desire to be with her.  While there was no evidence of this it was an internalize thought.  The conversations until them had have help to reassure her.  She has still found herself slipping back into that at times and the 2 are continuing to work to address the concerns on both sides.  Empathic listening techniques were  used and support was provided.  Amanda Daugherty had also raised some concern about the ongoing dizziness she has been experiencing.  She did connect  with a cardiologist and just finished a month-long Holter monitor.  She is worried that the Holter monitor will not show anything and that she was wrong thing the symptoms she was experiencing.  Patient has endorsed some past experiences where her concerns were minimized which she may have over internalized.  Now she can have concern that even if there is something wrong that she is just imagining it.  Provided support around this and encourage patient to continue with her cardiologist.  She did identify that she was positive for orthostatic hypotension which would explain several of her symptoms.  She has trialed increasing her sodium and compression stockings with some benefit.  Briefly discussed anxiety and food aversions.  Patient has noticed that when she gets more anxious she can eat less due to everything sounding unappetizing or making her nauseous.  This was brought up especially due to concern of dizziness being worse when she does not eat enough.  Reviewed some potential underlying causes that could lead to this and patient will continue to monitor symptoms to see if there is any associations.  Medication wise she has been taking the hydroxyzine  consistently at night with good effect for the most part.  There was 1 episode of increased anxiety interim where she needed to take a Klonopin  after poor sleep for 3 days.  This was related to an interpersonal stressor with a group of friends that has become less concerning as time has past.   Past Psychiatric History: She denies any prior psychiatric hospitalizations or suicide attempts  Has tried Atarax  for itching in the past, does not recall adverse side effects. Cymbalta  (effective but discontinued when her anxiety improved)  Denies any substance use other than one cup of caffeine a day or occasional social use.   Past Medical History:  Past Medical History:  Diagnosis Date   Chronic idiopathic constipation    GI-- dr stark   Endometriosis     Family history of protein S deficiency    pt's mother   History of ITP    IBS (irritable bowel syndrome)    Migraines    Pelvic pain    Protein S deficiency    hematology/ oncology--- dr onesimo   (06-06-2020  per pt has never had clot)   Protein S deficiency     Past Surgical History:  Procedure Laterality Date   LAPAROSCOPIC ENDOMETRIOSIS FULGURATION     LAPAROSCOPY N/A 06/09/2020   Procedure: LAPAROSCOPY DIAGNOSTIC, CAUTERY OF ENDOMETRIOSIS;  Surgeon: Leva Rush, MD;  Location: [facility];  Service: Gynecology;  Laterality: N/A;   TONSILLECTOMY  2017   Family History:  Family History  Problem Relation Age of Onset   Clotting disorder Mother    Mitral valve prolapse Mother    Hyperlipidemia Father    Colon cancer Maternal Grandmother    Uterine cancer Maternal Grandmother    Diabetes Maternal Grandfather    Heart disease Maternal Grandfather    Melanoma Paternal Grandmother    Heart disease Paternal Grandfather    Liver disease Neg Hx    Pancreatic cancer Neg Hx    Esophageal cancer Neg Hx    Stomach cancer Neg Hx     Social History:  Social History   Socioeconomic History   Marital status: Single    Spouse name: Not on file   Number  of children: Not on file   Years of education: Not on file   Highest education level: Not on file  Occupational History   Not on file  Tobacco Use   Smoking status: Never    Passive exposure: Never   Smokeless tobacco: Never  Vaping Use   Vaping status: Never Used  Substance and Sexual Activity   Alcohol use: Not Currently    Comment: social   Drug use: Never   Sexual activity: Not on file  Other Topics Concern   Not on file  Social History Narrative   Not on file   Social Drivers of Health   Financial Resource Strain: Not on file  Food Insecurity: Not on file  Transportation Needs: Not on file  Physical Activity: Not on file  Stress: Not on file  Social Connections: Not on file    Allergies: No  Known Allergies  Current Medications: Current Outpatient Medications  Medication Sig Dispense Refill   buPROPion  (WELLBUTRIN  XL) 300 MG 24 hr tablet Take 1 tablet (300 mg total) by mouth every morning. 90 tablet 0   clonazePAM  (KLONOPIN ) 0.5 MG tablet Take 1 tablet (0.5 mg total) by mouth daily as needed for anxiety. Pt states she takes monthly as needed 30 tablet 0   EPINEPHrine  0.3 mg/0.3 mL IJ SOAJ injection Inject 0.3 mg into the muscle as needed for anaphylaxis. 1 each 1   hydrOXYzine  (ATARAX ) 25 MG tablet Take 2 tablets (50 mg total) by mouth every evening. May also take 1 tablet (25 mg total) at bedtime as needed. 270 tablet 0   levonorgestrel (KYLEENA) 19.5 MG IUD Kyleena 17.5 mcg/24 hrs (74yrs) 19.5mg  intrauterine device  Take by intrauterine route.     linaclotide  (LINZESS ) 290 MCG CAPS capsule Take 1 capsule (290 mcg total) by mouth daily before breakfast. 90 capsule 3   Multiple Vitamin (MULTIVITAMIN ADULT PO) Take by mouth daily.     OVER THE COUNTER MEDICATION      Probiotic Product (PROBIOTIC DAILY) CAPS Take by mouth daily.     rivaroxaban  (XARELTO ) 10 MG TABS tablet Start taking 10mg  po once daily 24hours before and continue for 48 hours after any long distance travel exceeding 4-6 hours for DVT prophylaxis in context of protein S deficiency. 30 tablet 0   rizatriptan  (MAXALT ) 10 MG tablet Take 1 tablet (10 mg total) by mouth as needed for migraine. May repeat in 2 hours if needed 10 tablet 10   spironolactone (ALDACTONE) 50 MG tablet Take 25 mg by mouth daily.     SUMAtriptan  (IMITREX ) 100 MG tablet Take 100 mg by mouth every 2 (two) hours as needed for migraine.     No current facility-administered medications for this visit.     Psychiatric Specialty Exam: Review of Systems  There were no vitals taken for this visit.There is no height or weight on file to calculate BMI.  General Appearance: Fairly Groomed  Eye Contact:  Fair  Speech:  Clear and Coherent  Volume:   Normal  Mood:  Anxious and Euthymic  Affect:  Appropriate and Congruent  Thought Process:  Coherent and Goal Directed  Orientation:  Full (Time, Place, and Person)  Thought Content: Logical   Suicidal Thoughts:  No  Homicidal Thoughts:  No  Memory:  Immediate;   Good  Judgement:  Good  Insight:  Fair  Psychomotor Activity:  Decreased  Concentration:  Concentration: Fair  Recall:  [employer] of Knowledge: Fair  Language: Good  Akathisia:  NA    AIMS (if indicated): not done  Assets:  Communication Skills Desire for Improvement Housing Vocational/Educational  ADL's:  Intact  Cognition: WNL  Sleep:  Fair   Metabolic Disorder Labs: No results found for: HGBA1C, MPG No results found for: PROLACTIN Lab Results  Component Value Date   CHOL 191 06/04/2023   TRIG 61.0 06/04/2023   HDL 43.60 06/04/2023   CHOLHDL 4 06/04/2023   VLDL 12.2 06/04/2023   LDLCALC 135 (H) 06/04/2023   LDLCALC 123 (H) 03/22/2020   Lab Results  Component Value Date   TSH 0.92 06/04/2023   TSH 0.68 05/14/2022    Therapeutic Level Labs: No results found for: LITHIUM No results found for: VALPROATE No results found for: CBMZ   Screenings: GAD-7    Flowsheet Row Office Visit from 06/04/2023 in [facility] [employer] Visit from 03/23/2021 in [facility] [employer] Visit from 03/21/2020 in [facility] [employer]  Total GAD-7 Score 12 13 7    PHQ2-9    Flowsheet Row Office Visit from 10/01/2023 in [facility] Visit from 06/04/2023 in [facility] Visit from 05/10/2022 in [facility] [place] [employer] Visit from 03/23/2021 in [facility] [employer] Visit from 03/21/2020 in [facility] [place] [facility] [employer]  PHQ-2 Total  Score 0 1 4 2 2   PHQ-9 Total Score -- 11 15 12 5    Flowsheet Row Admission (Discharged) from 06/09/2020 in [facility]  C-SSRS RISK CATEGORY No Risk (P)     Collaboration of Care: Collaboration of Care: Medication Management AEB medication prescription and Other provider involved in patient's care AEB PCP and cardiology chart review  Patient/Guardian was advised Release of Information must be obtained prior to any record release in order to collaborate their care with an outside provider. Patient/Guardian was advised if they have not already done so to contact the registration department to sign all necessary forms in order for us  to release information regarding their care.   Consent: Patient/Guardian gives verbal consent for treatment and assignment of benefits for services provided during this visit. Patient/Guardian expressed understanding and agreed to proceed.    Arvella CHRISTELLA Finder, MD 01/07/2024, 10:39 AM   Virtual Visit via Video Note  I connected with Aleck Nephew on 01/07/24 at 10:30 AM EDT by a video enabled telemedicine application and verified that I am speaking with the correct person using two identifiers.  Location: Patient: Home Provider: Home Office   I discussed the limitations of evaluation and management by telemedicine and the availability of in person appointments. The patient expressed understanding and agreed to proceed.   I discussed the assessment and treatment plan with the patient. The patient was provided an opportunity to ask questions and all were answered. The patient agreed with the plan and demonstrated an understanding of the instructions.   The patient was advised to call back or seek an in-person evaluation if the symptoms worsen or if the condition fails to improve as anticipated.  40 minutes were spent in chart review, interview, psycho education, counseling, medical decision making, coordination of care and long-term prognosis.  Patient was given  opportunity to ask question and all concerns and questions were addressed and answers. Excluding separately billable services.   Arvella CHRISTELLA Finder, MD

## 2024-01-07 ENCOUNTER — Encounter (HOSPITAL_COMMUNITY): Payer: Self-pay | Admitting: Psychiatry

## 2024-01-07 ENCOUNTER — Telehealth (HOSPITAL_BASED_OUTPATIENT_CLINIC_OR_DEPARTMENT_OTHER): Admitting: Psychiatry

## 2024-01-07 DIAGNOSIS — F411 Generalized anxiety disorder: Secondary | ICD-10-CM | POA: Diagnosis not present

## 2024-01-07 DIAGNOSIS — F330 Major depressive disorder, recurrent, mild: Secondary | ICD-10-CM

## 2024-01-07 DIAGNOSIS — F329 Major depressive disorder, single episode, unspecified: Secondary | ICD-10-CM | POA: Diagnosis not present

## 2024-01-21 DIAGNOSIS — Z1151 Encounter for screening for human papillomavirus (HPV): Secondary | ICD-10-CM | POA: Diagnosis not present

## 2024-01-21 DIAGNOSIS — Z01419 Encounter for gynecological examination (general) (routine) without abnormal findings: Secondary | ICD-10-CM | POA: Diagnosis not present

## 2024-01-21 DIAGNOSIS — Z6821 Body mass index (BMI) 21.0-21.9, adult: Secondary | ICD-10-CM | POA: Diagnosis not present

## 2024-01-21 DIAGNOSIS — Z124 Encounter for screening for malignant neoplasm of cervix: Secondary | ICD-10-CM | POA: Diagnosis not present

## 2024-02-06 ENCOUNTER — Ambulatory Visit (HOSPITAL_COMMUNITY): Admission: RE | Admit: 2024-02-06 | Source: Ambulatory Visit

## 2024-02-07 ENCOUNTER — Ambulatory Visit: Payer: Self-pay

## 2024-02-07 DIAGNOSIS — R000 Tachycardia, unspecified: Secondary | ICD-10-CM | POA: Diagnosis not present

## 2024-02-07 DIAGNOSIS — R55 Syncope and collapse: Secondary | ICD-10-CM | POA: Diagnosis not present

## 2024-02-07 DIAGNOSIS — R42 Dizziness and giddiness: Secondary | ICD-10-CM | POA: Diagnosis not present

## 2024-02-24 NOTE — Progress Notes (Unsigned)
 BH MD/PA/NP OP Progress Note  02/25/2024 11:18 AM Amanda Daugherty  MRN:  [id number]  Visit Diagnosis:    ICD-10-CM   1. GAD (generalized anxiety disorder)  F41.1 buPROPion  (WELLBUTRIN  XL) 300 MG 24 hr tablet    clonazePAM  (KLONOPIN ) 0.5 MG tablet    2. Mild episode of recurrent major depressive disorder  F33.0 buPROPion  (WELLBUTRIN  XL) 300 MG 24 hr tablet      Assessment: Amanda Daugherty is a 28 y.o. female with a history of GAD who presented to [facility] for initial evaluation on 06/28/2022.  At initial evaluation patient reported symptoms of anxiety including racing thoughts, constant worry that she is unable to control, difficulty relaxing, restlessness, increased irritability, and feelings of imposter syndrome that is affecting her day to day. She has experienced panic attacks in the past. Patient also endorsed neurovegetative symptoms of depression including low mood, anhedonia, sleep disturbances, decreased appetite, difficulty concentrating, and fatigue. She denied any SI/HI or thoughts of self harm. Psychosocially patient is dealing with multiple stressors including her fiance living in another country and increased stress after starting a new job. She met criteria for GAD and MDD with further neuropsych testing being needed to confirm ADHD.  Amanda Daugherty presents for follow-up evaluation. Today, 02/25/24, patients anxiety and depression has been relatively stable over the past 6 weeks outside of 1 significant panic episode last week followed by residual dysthymia for 5 days.  Notably there had been significant stressors that day which led to multiple panic attacks.  The following dysthymia was also consistent with the resolution from the stressful event.  She is taking medication consistently and denies adverse side effects.  Given the infrequent nature of panic episodes we we will continue on current regimen recommended taking as needed  Klonopin  towards the beginning of any future episodes.  If frequency of panic episodes were to increase then we can reconsider starting SSRI/SNRI medications.  Will follow-up in 2 months.  Psychotherapeutic interventions were used during today's session. From 10:09 AM to 10:36 AM. Therapeutic interventions included empathic listening, supportive therapy, cognitive and behavioral therapy. Used supportive interviewing techniques to provide emotional validation. Worked on cognitive reframing techniques and unhelpful thoughts challenged as appropriate. Alternative thoughts developed with guidance.  Topics addressed included ability to receive and reciprocate support, setting a balance between scheduling time and giving self grace, and providing support and affirmation around recent panic episodes.  Improvement was evidenced by patient's participation and identified commitment to therapy goals.    Plan: - Continue Wellbutrin  300 mg XL daily - Continue Atarax  25 mg at bedtime and 25 mg prn - Continue Klonopin  0.5 mg QD prn for anxiety - Recommended light therapy - Continue therapy through [facility] - Neuropsych testing referral - Crisis resources reviewed - Follow up in 3 months  Chief Complaint:  Chief Complaint  Patient presents with   Follow-up   HPI: Amanda Daugherty presents reporting that she is doing good today, which is a change from last week.  She reports that can have days where she wakes up and her brain does not cooperate. A week ago she woke up, her brain was not seeming to function no matter what she did. She had multiple panic attacks, was crying whenever she attempted to talk, could not think straight. She had tried going to walk and using other coping skills without improvement. Luckily she had taken the day off already and had taken the day off to spend a  long weekend at the beach so work was not impacted. She can not identify any particular trigger for this as everything seemed good in  her life. Her and her partner doing well, while work can be stressful it is nothing out of the norm and she was off on Monday. Amanda Daugherty has done better at separating work and home since March.  Amanda Daugherty did have the plan pick her insurance on Monday when this happened which does cause stress. While she was doing this she had a lot of thoughts about what could happen next year for them, for instance pregnancy. This brought up other anxiety as his immigration case for a green card is still pending with a court case next year. If for whatever reason that is not approved he is deported. If it is approved then the can return back to their former lives of travel, getting a house, etc.  After talking about this she did start to develop a bit of insight into that the insurance caused a lot of forced acceptance. That they may not have a kid this year due to the uncertainty. This in turn would push back their timeline. For instance her husband would likely be older then 31 which was not something they had hoped for. Furthermore she was worried about her own health issues with endometriosis and getting pregnant. She also identified that she had planned to do all this planning last year, but did not due to her depression. So she was pressuring herself to do better this year.   On top of that she can start to get more negative about herself for not accomplishing the things she had planned for a day. Empathic listening techniques were used and support was provided. Reviewed self care techniques, worked on cognitive re framing around   She did take the Klonopin  around 4-5 pm which made it better, it makes her tired so she did not take it earlier. Tuesday she woke up feeling better but that she was hung over.  Previously when this has happened it had been during seasons of increased of stress and anxiety. Fear of happening again at work. After evaluating the circumstances it does seem like this was a major trigger and  there are no other ones upcoming other then the unscheduled hearing. Outside of this one incident things have been largely status quo.   Given this we discussed staying on current regimen and taking Klonopin  as needed earlier on an episodes of extreme panic.  If frequency were to increase in the future could consider addition of SSRI/SNRI medication.  Past Psychiatric History: She denies any prior psychiatric hospitalizations or suicide attempts  Has tried Atarax  for itching in the past, does not recall adverse side effects. Cymbalta  (effective but discontinued when her anxiety improved)  Denies any substance use other than one cup of caffeine a day or occasional social use.   Past Medical History:  Past Medical History:  Diagnosis Date   Chronic idiopathic constipation    GI-- dr stark   Endometriosis    Family history of protein S deficiency    pt's mother   History of ITP    IBS (irritable bowel syndrome)    Migraines    Pelvic pain    Protein S deficiency    hematology/ oncology--- dr onesimo   (06-06-2020  per pt has never had clot)   Protein S deficiency     Past Surgical History:  Procedure Laterality Date   LAPAROSCOPIC ENDOMETRIOSIS FULGURATION  LAPAROSCOPY N/A 06/09/2020   Procedure: LAPAROSCOPY DIAGNOSTIC, CAUTERY OF ENDOMETRIOSIS;  Surgeon: Leva Rush, MD;  Location: [facility];  Service: Gynecology;  Laterality: N/A;   TONSILLECTOMY  2017   Family History:  Family History  Problem Relation Age of Onset   Clotting disorder Mother    Mitral valve prolapse Mother    Hyperlipidemia Father    Colon cancer Maternal Grandmother    Uterine cancer Maternal Grandmother    Diabetes Maternal Grandfather    Heart disease Maternal Grandfather    Melanoma Paternal Grandmother    Heart disease Paternal Grandfather    Liver disease Neg Hx    Pancreatic cancer Neg Hx    Esophageal cancer Neg Hx    Stomach cancer Neg Hx     Social History:  Social  History   Socioeconomic History   Marital status: Single    Spouse name: Not on file   Number of children: Not on file   Years of education: Not on file   Highest education level: Not on file  Occupational History   Not on file  Tobacco Use   Smoking status: Never    Passive exposure: Never   Smokeless tobacco: Never  Vaping Use   Vaping status: Never Used  Substance and Sexual Activity   Alcohol use: Not Currently    Comment: social   Drug use: Never   Sexual activity: Not on file  Other Topics Concern   Not on file  Social History Narrative   Not on file   Social Drivers of Health   Financial Resource Strain: Not on file  Food Insecurity: Not on file  Transportation Needs: Not on file  Physical Activity: Not on file  Stress: Not on file  Social Connections: Not on file    Allergies: No Known Allergies  Current Medications: Current Outpatient Medications  Medication Sig Dispense Refill   [START ON 03/25/2024] buPROPion  (WELLBUTRIN  XL) 300 MG 24 hr tablet Take 1 tablet (300 mg total) by mouth every morning. 90 tablet 0   [START ON 03/02/2024] clonazePAM  (KLONOPIN ) 0.5 MG tablet Take 1 tablet (0.5 mg total) by mouth daily as needed for anxiety. Pt states she takes monthly as needed 30 tablet 1   EPINEPHrine  0.3 mg/0.3 mL IJ SOAJ injection Inject 0.3 mg into the muscle as needed for anaphylaxis. 1 each 1   hydrOXYzine  (ATARAX ) 25 MG tablet Take 2 tablets (50 mg total) by mouth every evening. May also take 1 tablet (25 mg total) at bedtime as needed. 270 tablet 0   levonorgestrel (Amanda) 19.5 MG IUD Amanda 17.5 mcg/24 hrs (34yrs) 19.5mg  intrauterine device  Take by intrauterine route.     linaclotide  (LINZESS ) 290 MCG CAPS capsule Take 1 capsule (290 mcg total) by mouth daily before breakfast. 90 capsule 3   Multiple Vitamin (MULTIVITAMIN ADULT PO) Take by mouth daily.     OVER THE COUNTER MEDICATION      Probiotic Product (PROBIOTIC DAILY) CAPS Take by mouth daily.      rivaroxaban  (XARELTO ) 10 MG TABS tablet Start taking 10mg  po once daily 24hours before and continue for 48 hours after any long distance travel exceeding 4-6 hours for DVT prophylaxis in context of protein S deficiency. 30 tablet 0   rizatriptan  (MAXALT ) 10 MG tablet Take 1 tablet (10 mg total) by mouth as needed for migraine. May repeat in 2 hours if needed 10 tablet 10   spironolactone (ALDACTONE) 50 MG tablet Take 25 mg by mouth  daily.     SUMAtriptan  (IMITREX ) 100 MG tablet Take 100 mg by mouth every 2 (two) hours as needed for migraine.     No current facility-administered medications for this visit.     Psychiatric Specialty Exam: Review of Systems  There were no vitals taken for this visit.There is no height or weight on file to calculate BMI.  General Appearance: Fairly Groomed  Eye Contact:  Fair  Speech:  Clear and Coherent  Volume:  Normal  Mood:  Anxious and Euthymic  Affect:  Appropriate and Congruent  Thought Process:  Coherent and Goal Directed  Orientation:  Full (Time, Place, and Person)  Thought Content: Logical   Suicidal Thoughts:  No  Homicidal Thoughts:  No  Memory:  Immediate;   Good  Judgement:  Good  Insight:  Fair  Psychomotor Activity:  Decreased  Concentration:  Concentration: Fair  Recall:  Fair  Fund of Knowledge: Fair  Language: Good  Akathisia:  NA    AIMS (if indicated): not done  Assets:  Communication Skills Desire for Improvement Housing Vocational/Educational  ADL's:  Intact  Cognition: WNL  Sleep:  Fair   Metabolic Disorder Labs: No results found for: HGBA1C, MPG No results found for: PROLACTIN Lab Results  Component Value Date   CHOL 191 06/04/2023   TRIG 61.0 06/04/2023   HDL 43.60 06/04/2023   CHOLHDL 4 06/04/2023   VLDL 12.2 06/04/2023   LDLCALC 135 (H) 06/04/2023   LDLCALC 123 (H) 03/22/2020   Lab Results  Component Value Date   TSH 0.92 06/04/2023   TSH 0.68 05/14/2022    Therapeutic Level Labs: No  results found for: LITHIUM No results found for: VALPROATE No results found for: CBMZ   Screenings: GAD-7    Flowsheet Row Office Visit from 06/04/2023 in [facility] [employer] Visit from 03/23/2021 in [facility] [employer] Visit from 03/21/2020 in [facility] [employer]  Total GAD-7 Score 12 13 7    PHQ2-9    Flowsheet Row Office Visit from 10/01/2023 in [facility] Visit from 06/04/2023 in [facility] [employer] Visit from 05/10/2022 in [facility] [employer] Visit from 03/23/2021 in [facility] [employer] Visit from 03/21/2020 in [facility] [employer]  PHQ-2 Total Score 0 1 4 2 2   PHQ-9 Total Score -- 11 15 12 5    Flowsheet Row Admission (Discharged) from 06/09/2020 in [facility]  C-SSRS RISK CATEGORY No Risk (P)     Collaboration of Care: Collaboration of Care: Medication Management AEB medication prescription and Other provider involved in patient's care AEB  OB and cardiology chart review  Patient/Guardian was advised Release of Information must be obtained prior to any record release in order to collaborate their care with an outside provider. Patient/Guardian was advised if they have not already done so to contact the registration department to sign all necessary forms in order for us  to release information regarding their care.   Consent: Patient/Guardian gives verbal consent for treatment and assignment of benefits for services provided during this visit. Patient/Guardian expressed understanding and agreed to proceed.    Amanda CHRISTELLA Finder, MD 02/25/2024, 11:18 AM   Virtual Visit via Video Note  I connected with Amanda Daugherty on 02/25/24 at 10:00 AM EST by a video enabled telemedicine  application and verified that I  am speaking with the correct person using two identifiers.  Location: Patient: Home Provider: Home Office   I discussed the limitations of evaluation and management by telemedicine and the availability of in person appointments. The patient expressed understanding and agreed to proceed.   I discussed the assessment and treatment plan with the patient. The patient was provided an opportunity to ask questions and all were answered. The patient agreed with the plan and demonstrated an understanding of the instructions.   The patient was advised to call back or seek an in-person evaluation if the symptoms worsen or if the condition fails to improve as anticipated.   I provided 35 minutes of non-face-to-face time during this encounter.   Amanda CHRISTELLA Finder, MD

## 2024-02-25 ENCOUNTER — Telehealth (HOSPITAL_COMMUNITY): Admitting: Psychiatry

## 2024-02-25 ENCOUNTER — Encounter (HOSPITAL_COMMUNITY): Payer: Self-pay | Admitting: Psychiatry

## 2024-02-25 DIAGNOSIS — F330 Major depressive disorder, recurrent, mild: Secondary | ICD-10-CM | POA: Diagnosis not present

## 2024-02-25 DIAGNOSIS — F411 Generalized anxiety disorder: Secondary | ICD-10-CM

## 2024-02-25 MED ORDER — CLONAZEPAM 0.5 MG PO TABS: 0.5000 mg | ORAL_TABLET | Freq: Every day | ORAL | 1 refills | Status: AC | PRN

## 2024-02-25 MED ORDER — BUPROPION HCL ER (XL) 300 MG PO TB24: 300.0000 mg | ORAL_TABLET | Freq: Every morning | ORAL | 0 refills | Status: DC

## 2024-03-11 DIAGNOSIS — R1032 Left lower quadrant pain: Secondary | ICD-10-CM | POA: Diagnosis not present

## 2024-03-17 ENCOUNTER — Ambulatory Visit (HOSPITAL_COMMUNITY): Admission: RE | Admit: 2024-03-17 | Discharge: 2024-03-17 | Attending: Cardiology | Admitting: Cardiology

## 2024-03-17 DIAGNOSIS — I951 Orthostatic hypotension: Secondary | ICD-10-CM

## 2024-03-17 LAB — ECHOCARDIOGRAM COMPLETE
Area-P 1/2: 5.46 cm2
S' Lateral: 3.2 cm

## 2024-03-18 ENCOUNTER — Ambulatory Visit: Payer: Self-pay

## 2024-03-20 NOTE — Progress Notes (Unsigned)
°   °  °  Cardiology Office Note Date:  03/24/2024  ID:  Amanda Daugherty, DOB January 16, 1996, MRN [id number] PCP:  Katheen Roselie Rockford, NP  Cardiologist:   Joelle VEAR Ren Donley, MD  Chief Complaint  Patient presents with   orthostasis    Problems Orthostasis c/f POTS vs. OH OH positive in PCP office Drinking fluids/ECG Normal Held spiro TTE 12/25 55-60% EM 10/25 multiple triggered episodes with no evidence of significant arrhythmia  Visits  09/15: TTE, 7-day event monitor, planning to start having kid soon 12/25: Follow up in 6 months  History of Present Illness: Discussed the use of AI scribe software for clinical note transcription with the patient, who gave verbal consent to proceed.  Amanda Daugherty is a 28 year old female with orthostatic hypotension who presents with dizziness and management of symptoms. She reports marked improvement in dizziness after increasing sodium intake with electrolyte powders. Dizziness now occurs rarely, mainly twice during a recent workout, and is triggered by large movements such as bending, stretching, or lifting boxes, but not by climbing stairs. She uses an abdominal binder daily at work and for activities with significant movement, which she tolerates better than compression leggings. She uses compression leggings only a few times per week. She does not monitor blood pressure at home. She is considering pregnancy and is concerned about the safety of her current medications in pregnancy. She does Pilates three to four times per week. She recently moved back to [place] to be near family support.   ROS: Otherwise negative  Physical Exam VS:  BP 114/70 (BP Location: Left Arm, Patient Position: Sitting)   Pulse 85   Ht 5' 8 (1.727 m)   Wt 139 lb 12.8 oz (63.4 kg)   SpO2 99%   BMI 21.26 kg/m  , BMI Body mass index is 21.26 kg/m. GEN: Well nourished, well developed, in no acute distress HEENT: normal Neck: no JVD, carotid bruits, or  masses Cardiac: RRR; no murmurs, rubs, or gallops,no edema  Respiratory:  CTAB bilaterally, normal work of breathing GI: soft, nontender, nondistended, + BS Extremities: No LE edema Skin: warm and dry, no rash Neuro:  Strength and sensation are intact  Recent Labs: Reviewed  ASSESSMENT AND PLAN [name] is a 28 y.o. female who presents for follow up.   Orthostatic hypotension Dizziness improved with increased sodium and compression garments. Persistent dizziness with large movements. No cardiac abnormalities on Holter and echocardiogram, suggesting peripheral nervous system involvement. Discussed non-pharmacological management during pregnancy due to limited medication safety data. - Continue increased sodium intake cautiously. - Monitor blood pressure twice weekly. - Use abdominal binder daily, especially during significant movement. - Consider recumbent biking and swimming. - Avoid pharmacological interventions during pregnancy if possible. - Follow up in 6 months       Signed, Joelle VEAR Ren Donley, MD  03/24/2024 9:02 AM    [facility]

## 2024-03-24 ENCOUNTER — Ambulatory Visit

## 2024-03-24 VITALS — BP 114/70 | HR 85 | Ht 68.0 in | Wt 139.8 lb

## 2024-03-24 DIAGNOSIS — R42 Dizziness and giddiness: Secondary | ICD-10-CM | POA: Diagnosis not present

## 2024-03-24 DIAGNOSIS — I951 Orthostatic hypotension: Secondary | ICD-10-CM | POA: Diagnosis not present

## 2024-03-24 DIAGNOSIS — R000 Tachycardia, unspecified: Secondary | ICD-10-CM

## 2024-03-24 NOTE — Patient Instructions (Signed)
°  Follow-Up: At [facility], you and your health needs are our priority.  As part of our continuing mission to provide you with exceptional heart care, our providers are all part of one team.  This team includes your primary Cardiologist (physician) and Advanced Practice Providers or APPs (Physician Assistants and Nurse Practitioners) who all work together to provide you with the care you need, when you need it.  Your next appointment:   6 month(s)  Provider:   Joelle VEAR Ren Donley, MD

## 2024-04-16 ENCOUNTER — Telehealth (HOSPITAL_COMMUNITY): Admitting: Psychiatry

## 2024-04-23 ENCOUNTER — Telehealth (HOSPITAL_COMMUNITY): Admitting: Psychiatry

## 2024-05-04 NOTE — Progress Notes (Unsigned)
 BH MD/PA/NP OP Progress Note  05/07/2024 2:38 PM Amanda Daugherty  MRN:  [id number]  Visit Diagnosis:    ICD-10-CM   1. GAD (generalized anxiety disorder)  F41.1 buPROPion  (WELLBUTRIN  XL) 300 MG 24 hr tablet    hydrOXYzine  (ATARAX ) 25 MG tablet    2. Mild episode of recurrent major depressive disorder  F33.0 buPROPion  (WELLBUTRIN  XL) 300 MG 24 hr tablet     Assessment: Amanda Daugherty is a 29 y.o. female with a history of GAD who presented to [facility] at [facility] for initial evaluation on 06/28/2022.  At initial evaluation patient reported symptoms of anxiety including racing thoughts, constant worry that she is unable to control, difficulty relaxing, restlessness, increased irritability, and feelings of imposter syndrome that is affecting her day to day. She has experienced panic attacks in the past. Patient also endorsed neurovegetative symptoms of depression including low mood, anhedonia, sleep disturbances, decreased appetite, difficulty concentrating, and fatigue. She denied any SI/HI or thoughts of self harm. Psychosocially patient is dealing with multiple stressors including her fiance living in another country and increased stress after starting a new job. She met criteria for GAD and MDD with further neuropsych testing being needed to confirm ADHD.  Amanda Daugherty Nephew presents for follow-up evaluation. Today, 05/07/24, patient had experienced elevated anxiety and depression shortly after last appointment related to psychosocial stressor bringing up past fears.  Stressor has partially resolved and anxiety has interned decreased.  That said she does still experience baseline levels of anxiety related to world events.  She is taking medication consistently and denies adverse side effects.  Patient had 1 panic attack in the interim and took 0.25 mg of Klonopin  with good effect.  Patient had increased Klonopin  use during December when anxiety and depression symptoms  were more prevalent. Usage has decreased to 1-2 times a week currently.  Will continue on current regimen and follow-up in 3 months.  Psychotherapeutic interventions were used during today's session. From 10:09 AM to 10:36 AM. Therapeutic interventions included empathic listening, supportive therapy, cognitive and behavioral therapy. Used supportive interviewing techniques to provide emotional validation. Worked on cognitive reframing techniques and unhelpful thoughts challenged as appropriate. Alternative thoughts developed with guidance.  Topics addressed included identifying black-and-white thought processes, incorporating behavioral activation into her life, and how to effectively communicate with her partner regarding their anxieties.  Improvement was evidenced by patient's participation and identified commitment to therapy goals.   Plan: - Continue Wellbutrin  300 mg XL daily - Continue Atarax  25 mg at bedtime and 25 mg prn - Continue Klonopin  0.5 mg QD prn for anxiety - Recommended light therapy - Continue therapy through [facility] - Neuropsych testing referral - Crisis resources reviewed - Follow up in 3 months  Chief Complaint:  Chief Complaint  Patient presents with   Follow-up   HPI: Amanda Daugherty presents reporting that she is doing okay. About a month after her last appointment she received a notice that his interview was scheduled. That month was stressful as he was preparing for the worst, for instance what if she has to move to the [place]. Every day after work they would spend an hour prepping for the interview. Luckily the interview went really well and they were not put under the stress they were expecting. Now they are just waiting on the government to mail their response which leaves them in limbo. She is more optimistic as the interview went well.  During the December to January wait for the  interview she had been taking Klonopin  more consistently.  Since then the frequency has  decreased to 1-2 times a week.  With the current political climate they have isolated a bit more out of fear/paranoia. This is further complicated by his inability to work for over a year now. The dynamic of their lives and relationship has changed significantly.  Empathic listening techniques were used and support was provided.  Discussed some strategies to gradually implement behavioral activation particularly after they receive a response regarding interview.  Help to temper patient's automatic viewing of things and black-and-white.  She is aware of her tendency to do this and has been working to identify that things are not immediately going to return to their old life upon her partner getting his green card.  She had a huge panic attack 2 days ago when EMS/police mistakenly showed up to their place instead of the neighbors who fell. She was fearful that her partner was being taken, and when it came down it left her feeling bad about others who go through this. She took 0.25 mg which was effective in calming her down.  Patient did try to discontinue the hydroxyzine  the past couple days to see if she is able to sleep without it.  She found the first night was okay but last night she only slept an hour.  We reviewed her current levels of stress and recent panic episode and suggested remaining on the hydroxyzine  for now and reassessing discontinuation at next appointment.  Past Psychiatric History: She denies any prior psychiatric hospitalizations or suicide attempts  Has tried Atarax  for itching in the past, does not recall adverse side effects. Cymbalta  (effective but discontinued when her anxiety improved)  Denies any substance use other than one cup of caffeine a day or occasional social use.   Past Medical History:  Past Medical History:  Diagnosis Date   Chronic idiopathic constipation    GI-- dr stark   Endometriosis    Family history of protein S deficiency    pt's mother   History  of ITP    IBS (irritable bowel syndrome)    Migraines    Pelvic pain    Protein S deficiency    hematology/ oncology--- dr onesimo   (06-06-2020  per pt has never had clot)   Protein S deficiency     Past Surgical History:  Procedure Laterality Date   LAPAROSCOPIC ENDOMETRIOSIS FULGURATION     LAPAROSCOPY N/A 06/09/2020   Procedure: LAPAROSCOPY DIAGNOSTIC, CAUTERY OF ENDOMETRIOSIS;  Surgeon: Leva Rush, MD;  Location: [facility];  Service: Gynecology;  Laterality: N/A;   TONSILLECTOMY  2017   Family History:  Family History  Problem Relation Age of Onset   Clotting disorder Mother    Mitral valve prolapse Mother    Hyperlipidemia Father    Colon cancer Maternal Grandmother    Uterine cancer Maternal Grandmother    Diabetes Maternal Grandfather    Heart disease Maternal Grandfather    Melanoma Paternal Grandmother    Heart disease Paternal Grandfather    Liver disease Neg Hx    Pancreatic cancer Neg Hx    Esophageal cancer Neg Hx    Stomach cancer Neg Hx     Social History:  Social History   Socioeconomic History   Marital status: Single    Spouse name: Not on file   Number of children: Not on file   Years of education: Not on file   Highest education level: Not on  file  Occupational History   Not on file  Tobacco Use   Smoking status: Never    Passive exposure: Never   Smokeless tobacco: Never  Vaping Use   Vaping status: Never Used  Substance and Sexual Activity   Alcohol use: Not Currently    Comment: social   Drug use: Never   Sexual activity: Not on file  Other Topics Concern   Not on file  Social History Narrative   Not on file   Social Drivers of Health   Tobacco Use: Low Risk (05/07/2024)   Patient History    Smoking Tobacco Use: Never    Smokeless Tobacco Use: Never    Passive Exposure: Never  Financial Resource Strain: Not on file  Food Insecurity: Not on file  Transportation Needs: Not on file  Physical Activity: Not on  file  Stress: Not on file  Social Connections: Not on file  Depression (PHQ2-9): Low Risk (10/01/2023)   Depression (PHQ2-9)    PHQ-2 Score: 0  Alcohol Screen: Not on file  Housing: Not on file  Utilities: Not on file  Health Literacy: Not on file    Allergies: No Known Allergies  Current Medications: Current Outpatient Medications  Medication Sig Dispense Refill   buPROPion  (WELLBUTRIN  XL) 300 MG 24 hr tablet Take 1 tablet (300 mg total) by mouth every morning. 90 tablet 0   clonazePAM  (KLONOPIN ) 0.5 MG tablet Take 1 tablet (0.5 mg total) by mouth daily as needed for anxiety. Pt states she takes monthly as needed 30 tablet 1   EPINEPHrine  0.3 mg/0.3 mL IJ SOAJ injection Inject 0.3 mg into the muscle as needed for anaphylaxis. 1 each 1   hydrOXYzine  (ATARAX ) 25 MG tablet Take 2 tablets (50 mg total) by mouth every evening. May also take 1 tablet (25 mg total) at bedtime as needed. 270 tablet 0   levonorgestrel (KYLEENA) 19.5 MG IUD Kyleena 17.5 mcg/24 hrs (79yrs) 19.5mg  intrauterine device  Take by intrauterine route.     linaclotide  (LINZESS ) 290 MCG CAPS capsule Take 1 capsule (290 mcg total) by mouth daily before breakfast. 90 capsule 3   Multiple Vitamin (MULTIVITAMIN ADULT PO) Take by mouth daily.     OVER THE COUNTER MEDICATION      Probiotic Product (PROBIOTIC DAILY) CAPS Take by mouth daily.     rivaroxaban  (XARELTO ) 10 MG TABS tablet Start taking 10mg  po once daily 24hours before and continue for 48 hours after any long distance travel exceeding 4-6 hours for DVT prophylaxis in context of protein S deficiency. 30 tablet 0   rizatriptan  (MAXALT ) 10 MG tablet Take 1 tablet (10 mg total) by mouth as needed for migraine. May repeat in 2 hours if needed 10 tablet 10   spironolactone (ALDACTONE) 50 MG tablet Take 25 mg by mouth daily.     SUMAtriptan  (IMITREX ) 100 MG tablet Take 100 mg by mouth every 2 (two) hours as needed for migraine.     No current facility-administered  medications for this visit.     Psychiatric Specialty Exam: Review of Systems  There were no vitals taken for this visit.There is no height or weight on file to calculate BMI.  General Appearance: Fairly Groomed  Eye Contact:  Fair  Speech:  Clear and Coherent  Volume:  Normal  Mood:  Anxious and Euthymic  Affect:  Appropriate and Congruent  Thought Process:  Coherent and Goal Directed  Orientation:  Full (Time, Place, and Person)  Thought Content: Logical   Suicidal  Thoughts:  No  Homicidal Thoughts:  No  Memory:  Immediate;   Good  Judgement:  Good  Insight:  Fair  Psychomotor Activity:  Decreased  Concentration:  Concentration: Fair  Recall:  Fair  Fund of Knowledge: Fair  Language: Good  Akathisia:  NA    AIMS (if indicated): not done  Assets:  Communication Skills Desire for Improvement Housing Vocational/Educational  ADL's:  Intact  Cognition: WNL  Sleep:  Fair   Metabolic Disorder Labs: No results found for: HGBA1C, MPG No results found for: PROLACTIN Lab Results  Component Value Date   CHOL 191 06/04/2023   TRIG 61.0 06/04/2023   HDL 43.60 06/04/2023   CHOLHDL 4 06/04/2023   VLDL 12.2 06/04/2023   LDLCALC 135 (H) 06/04/2023   LDLCALC 123 (H) 03/22/2020   Lab Results  Component Value Date   TSH 0.92 06/04/2023   TSH 0.68 05/14/2022    Therapeutic Level Labs: No results found for: LITHIUM No results found for: VALPROATE No results found for: CBMZ   Screenings: GAD-7    Flowsheet Row Office Visit from 06/04/2023 in [facility] [facility] [employer] Visit from 03/23/2021 in [facility] [place] [facility] [employer] Visit from 03/21/2020 in [facility] [place] [facility] [employer]  Total GAD-7 Score 12 13 7    PHQ2-9    Flowsheet Row Office Visit from 10/01/2023 in [facility] [place] [facility] Visit from 06/04/2023 in [facility] [place] [facility] Visit from 05[facility]10/2022 in [facility] [place] [facility] [employer] Visit from 03/23/2021 in [facility] [place] [facility] [employer] Visit from 03/21/2020 in [facility] [place] [facility] [employer]  PHQ-2 Total Score 0 1 4 2 2   PHQ-9 Total Score -- 11 15 12 5    Flowsheet Row Admission (Discharged) from 06/09/2020 in [facility]  C-SSRS RISK CATEGORY No Risk (P)     Collaboration of Care: Collaboration of Care: Medication Management AEB medication prescription and Other provider involved in patient's care AEB  Cardiology chart review  Patient/Guardian was advised Release of Information must be obtained prior to any record release in order to collaborate their care with an outside provider. Patient/Guardian was advised if they have not already done so to contact the registration department to sign all necessary forms in order for us  to release information regarding their care.   Consent: Patient/Guardian gives verbal consent for treatment and assignment of benefits for services provided during this visit. Patient/Guardian expressed understanding and agreed to proceed.    Arvella Daugherty Finder, MD 05/07/2024, 2:38 PM   Virtual Visit via Video Note  I connected with Amanda Nephew on 05/07/24 at  1:30 PM EST by a video enabled telemedicine application and verified that I am speaking with the correct person using two identifiers.  Location: Patient: Home Provider: Home Office   I discussed the limitations of evaluation and management by telemedicine and the availability of in person appointments. The patient expressed understanding and agreed to proceed.   I discussed the assessment and treatment plan with the patient. The patient was provided an opportunity to ask questions and all were answered. The patient agreed with the plan and demonstrated an understanding of the instructions.   The patient was advised to call back or seek  an in-person evaluation if the symptoms worsen or if the condition fails to improve as anticipated.   I provided 35 minutes of non-face-to-face time during this  encounter.   Arvella Daugherty Finder, MD

## 2024-05-07 ENCOUNTER — Encounter (HOSPITAL_COMMUNITY): Payer: Self-pay | Admitting: Psychiatry

## 2024-05-07 ENCOUNTER — Telehealth (HOSPITAL_COMMUNITY): Admitting: Psychiatry

## 2024-05-07 DIAGNOSIS — F411 Generalized anxiety disorder: Secondary | ICD-10-CM

## 2024-05-07 DIAGNOSIS — F330 Major depressive disorder, recurrent, mild: Secondary | ICD-10-CM | POA: Diagnosis not present

## 2024-05-07 MED ORDER — BUPROPION HCL ER (XL) 300 MG PO TB24: 300.0000 mg | ORAL_TABLET | Freq: Every morning | ORAL | 0 refills | Status: AC

## 2024-05-07 MED ORDER — HYDROXYZINE HCL 25 MG PO TABS: ORAL_TABLET | ORAL | 0 refills | Status: AC

## 2024-07-13 ENCOUNTER — Telehealth (HOSPITAL_COMMUNITY): Admitting: Psychiatry
